# Patient Record
Sex: Male | Born: 1955 | Race: Black or African American | Hispanic: No | Marital: Married | Smoking: Never smoker
Health system: Southern US, Community
[De-identification: ages and names within clinical notes are randomized; demographics above are authoritative.]

## PROBLEM LIST (undated history)

## (undated) DIAGNOSIS — R7303 Prediabetes: Secondary | ICD-10-CM

## (undated) DIAGNOSIS — I10 Essential (primary) hypertension: Secondary | ICD-10-CM

## (undated) DIAGNOSIS — M170 Bilateral primary osteoarthritis of knee: Secondary | ICD-10-CM

## (undated) HISTORY — PX: HERNIA REPAIR: SHX51

## (undated) HISTORY — PX: COLONOSCOPY: SHX174

## (undated) HISTORY — PX: INGUINAL HERNIA REPAIR: SUR1180

---

## 2004-11-04 ENCOUNTER — Other Ambulatory Visit: Payer: Self-pay

## 2004-11-04 ENCOUNTER — Emergency Department: Payer: Self-pay | Admitting: Emergency Medicine

## 2007-12-28 ENCOUNTER — Ambulatory Visit: Payer: Self-pay | Admitting: Internal Medicine

## 2008-02-01 ENCOUNTER — Ambulatory Visit: Payer: Self-pay | Admitting: Gastroenterology

## 2008-06-25 ENCOUNTER — Ambulatory Visit: Payer: Self-pay | Admitting: Internal Medicine

## 2010-02-11 ENCOUNTER — Other Ambulatory Visit: Payer: Self-pay | Admitting: Internal Medicine

## 2010-02-13 ENCOUNTER — Ambulatory Visit: Payer: Self-pay | Admitting: Internal Medicine

## 2011-11-04 ENCOUNTER — Emergency Department: Payer: Self-pay | Admitting: Emergency Medicine

## 2013-02-24 ENCOUNTER — Ambulatory Visit: Payer: Self-pay | Admitting: Unknown Physician Specialty

## 2013-08-22 ENCOUNTER — Encounter (HOSPITAL_COMMUNITY): Payer: Self-pay | Admitting: Emergency Medicine

## 2013-08-22 ENCOUNTER — Emergency Department (HOSPITAL_COMMUNITY)
Admission: EM | Admit: 2013-08-22 | Discharge: 2013-08-22 | Disposition: A | Discharge status: Home / Self Care | Payer: 59 | Attending: Emergency Medicine | Admitting: Emergency Medicine

## 2013-08-22 DIAGNOSIS — X503XXA Overexertion from repetitive movements, initial encounter: Secondary | ICD-10-CM | POA: Insufficient documentation

## 2013-08-22 DIAGNOSIS — I10 Essential (primary) hypertension: Secondary | ICD-10-CM | POA: Insufficient documentation

## 2013-08-22 DIAGNOSIS — S335XXA Sprain of ligaments of lumbar spine, initial encounter: Secondary | ICD-10-CM | POA: Insufficient documentation

## 2013-08-22 DIAGNOSIS — Z79899 Other long term (current) drug therapy: Secondary | ICD-10-CM | POA: Insufficient documentation

## 2013-08-22 DIAGNOSIS — S39012A Strain of muscle, fascia and tendon of lower back, initial encounter: Secondary | ICD-10-CM

## 2013-08-22 DIAGNOSIS — Y9389 Activity, other specified: Secondary | ICD-10-CM | POA: Insufficient documentation

## 2013-08-22 DIAGNOSIS — Y929 Unspecified place or not applicable: Secondary | ICD-10-CM | POA: Insufficient documentation

## 2013-08-22 HISTORY — DX: Prediabetes: R73.03

## 2013-08-22 HISTORY — DX: Essential (primary) hypertension: I10

## 2013-08-22 LAB — URINALYSIS, ROUTINE W REFLEX MICROSCOPIC
Bilirubin Urine: NEGATIVE
Glucose, UA: NEGATIVE mg/dL
Ketones, ur: NEGATIVE mg/dL
Leukocytes, UA: NEGATIVE
Nitrite: NEGATIVE
Protein, ur: NEGATIVE mg/dL

## 2013-08-22 LAB — CBC WITH DIFFERENTIAL/PLATELET
Basophils Absolute: 0 10*3/uL (ref 0.0–0.1)
Basophils Relative: 0 % (ref 0–1)
Eosinophils Absolute: 0.2 10*3/uL (ref 0.0–0.7)
HCT: 49.5 % (ref 39.0–52.0)
Hemoglobin: 16.7 g/dL (ref 13.0–17.0)
Lymphs Abs: 2.4 10*3/uL (ref 0.7–4.0)
MCH: 27.6 pg (ref 26.0–34.0)
MCHC: 33.7 g/dL (ref 30.0–36.0)
Monocytes Relative: 8 % (ref 3–12)
Neutro Abs: 3.1 10*3/uL (ref 1.7–7.7)
Neutrophils Relative %: 50 % (ref 43–77)
RBC: 6.04 MIL/uL — ABNORMAL HIGH (ref 4.22–5.81)

## 2013-08-22 LAB — COMPREHENSIVE METABOLIC PANEL
Albumin: 4.1 g/dL (ref 3.5–5.2)
Alkaline Phosphatase: 56 U/L (ref 39–117)
BUN: 18 mg/dL (ref 6–23)
Chloride: 100 mEq/L (ref 96–112)
Creatinine, Ser: 1.34 mg/dL (ref 0.50–1.35)
GFR calc Af Amer: 65 mL/min — ABNORMAL LOW (ref >90)
GFR calc non Af Amer: 56 mL/min — ABNORMAL LOW (ref >90)
Glucose, Bld: 99 mg/dL (ref 70–99)
Potassium: 3.5 mEq/L (ref 3.5–5.1)
Total Bilirubin: 0.2 mg/dL — ABNORMAL LOW (ref 0.3–1.2)
Total Protein: 8 g/dL (ref 6.0–8.3)

## 2013-08-22 LAB — URINE MICROSCOPIC-ADD ON

## 2013-08-22 MED ORDER — KETOROLAC TROMETHAMINE 30 MG/ML IJ SOLN: 30.0000 mg | Freq: Once | INTRAMUSCULAR | Status: DC

## 2013-08-22 MED ORDER — OXYCODONE-ACETAMINOPHEN 5-325 MG PO TABS
2.0000 | ORAL_TABLET | Freq: Once | ORAL | Status: AC
  Administered 2013-08-22: 2 via ORAL
  Filled 2013-08-22: qty 2

## 2013-08-22 MED ORDER — NAPROXEN 500 MG PO TABS: 500.0000 mg | ORAL_TABLET | Freq: Two times a day (BID) | ORAL | Status: DC

## 2013-08-22 MED ORDER — OXYCODONE-ACETAMINOPHEN 5-325 MG PO TABS: 2.0000 | ORAL_TABLET | Freq: Four times a day (QID) | ORAL | Status: DC | PRN

## 2013-08-22 MED ORDER — NAPROXEN 250 MG PO TABS
500.0000 mg | ORAL_TABLET | Freq: Two times a day (BID) | ORAL | Status: DC
  Administered 2013-08-22: 500 mg via ORAL
  Filled 2013-08-22: qty 2

## 2013-08-22 MED ORDER — METHOCARBAMOL 750 MG PO TABS: 1500.0000 mg | ORAL_TABLET | Freq: Three times a day (TID) | ORAL | Status: DC

## 2013-08-22 MED ORDER — METHOCARBAMOL 500 MG PO TABS
1500.0000 mg | ORAL_TABLET | Freq: Three times a day (TID) | ORAL | Status: DC
  Administered 2013-08-22: 1500 mg via ORAL
  Filled 2013-08-22: qty 3

## 2013-08-22 NOTE — ED Provider Notes (Signed)
CSN: [id number]     Arrival date & time 08/22/13  4098 History   First MD Initiated Contact with Patient 08/22/13 [phone]     Chief Complaint  Patient presents with  . Abdominal Pain   (Consider location/radiation/quality/duration/timing/severity/associated sxs/prior Treatment) HPI 57 year old male presents to emergency room with complaint of intermittent low back pain radiating around to his abdomen starting a week ago Saturday.  Patient reports he does heavy lifting at work.  No prior history of same.  Pain starts in his low back and wraps around.  Patient thought he might have a urinary tract infection, and drank cranberry juice with possible improvement.  Over the last few days, however, symptoms have worsened.  Patient reports pain with movement.  No nausea no vomiting.  No diarrhea.  No incontinence.  No fevers.  Pain is worse after sitting for a period time, and improves, as he moves.  No treatment prior to arrival.  Past history of diabetes, borderline, and hypertension.  He has had a bilateral inguinal hernia repairs.  No dysuria. Past Medical History  Diagnosis Date  . Borderline diabetes   . Hypertension    Past Surgical History  Procedure Laterality Date  . Hernia repair     No family history on file. History  Substance Use Topics  . Smoking status: Never Smoker   . Smokeless tobacco: Not on file  . Alcohol Use: No    Review of Systems  All other systems reviewed and are negative.    Allergies  Shrimp  Home Medications   Current Outpatient Rx  Name  Route  Sig  Dispense  Refill  . acetaminophen (TYLENOL) 500 MG tablet   Oral   Take 500 mg by mouth every 6 (six) hours as needed for mild pain or moderate pain (arthritis).         Marland Kitchen losartan-hydrochlorothiazide (HYZAAR) 100-12.5 MG per tablet   Oral   Take 1 tablet by mouth daily.         . methocarbamol (ROBAXIN) 750 MG tablet   Oral   Take 2 tablets (1,500 mg total) by mouth 3 (three) times daily.   30  tablet   0   . naproxen (NAPROSYN) 500 MG tablet   Oral   Take 1 tablet (500 mg total) by mouth 2 (two) times daily with a meal.   30 tablet   0   . oxyCODONE-acetaminophen (PERCOCET/ROXICET) 5-325 MG per tablet   Oral   Take 2 tablets by mouth every 6 (six) hours as needed for severe pain.   30 tablet   0    BP 119/81  Pulse 74  Temp(Src) 97.6 F (36.4 C) (Oral)  Resp 14  Wt 222 lb 6 oz (100.869 kg)  SpO2 99% Physical Exam  Nursing note and vitals reviewed. Constitutional: He is oriented to person, place, and time. He appears well-developed and well-nourished. No distress.  HENT:  Head: Normocephalic and atraumatic.  Nose: Nose normal.  Mouth/Throat: Oropharynx is clear and moist.  Eyes: Conjunctivae and EOM are normal. Pupils are equal, round, and reactive to light.  Neck: Normal range of motion. Neck supple. No JVD present. No tracheal deviation present. No thyromegaly present.  Cardiovascular: Normal rate, regular rhythm, normal heart sounds and intact distal pulses.  Exam reveals no gallop and no friction rub.   No murmur heard. Pulmonary/Chest: Effort normal and breath sounds normal. No stridor. No respiratory distress. He has no wheezes. He has no rales. He exhibits no tenderness.  Abdominal: Soft. Bowel sounds are normal. He exhibits no distension and no mass. There is no tenderness (abdomen is soft, no tenderness.). There is no rebound and no guarding.  Musculoskeletal: Normal range of motion. He exhibits tenderness (patient has tenderness paraspinal, low back and SI joints bilaterally.  Palpation of this area reproduces his pain). He exhibits no edema.  Lymphadenopathy:    He has no cervical adenopathy.  Neurological: He is alert and oriented to person, place, and time. He has normal reflexes. He exhibits normal muscle tone. Coordination normal.  Skin: Skin is warm and dry. No rash noted. No erythema. No pallor.  Psychiatric: He has a normal mood and affect. His  behavior is normal. Judgment and thought content normal.    ED Course  Procedures (including critical care time) Labs Review Labs Reviewed  CBC WITH DIFFERENTIAL - Abnormal; Notable for the following:    RBC 6.04 (*)    All other components within normal limits  COMPREHENSIVE METABOLIC PANEL - Abnormal; Notable for the following:    Total Bilirubin 0.2 (*)    GFR calc non Af Amer 56 (*)    GFR calc Af Amer 65 (*)    All other components within normal limits  URINALYSIS, ROUTINE W REFLEX MICROSCOPIC - Abnormal; Notable for the following:    Hgb urine dipstick SMALL (*)    All other components within normal limits  URINE MICROSCOPIC-ADD ON   Imaging Review No results found.  EKG Interpretation   None       MDM   1. Back strain, initial encounter    57 year old male with low back pain.  No red flags on history or physical.  Labs normal.  Will tx pain, have patient f/u with pcm.    Olivia Mackie, MD 08/22/13 [phone]

## 2013-08-22 NOTE — ED Notes (Signed)
Pt. reports low abdominal and low back pain for 2 weeks denies nausea , vomitting or diarrhea . No fever or chills.

## 2013-09-11 ENCOUNTER — Ambulatory Visit: Payer: Self-pay | Admitting: Family Medicine

## 2014-08-21 DIAGNOSIS — Z8679 Personal history of other diseases of the circulatory system: Secondary | ICD-10-CM | POA: Insufficient documentation

## 2015-02-26 DIAGNOSIS — Z7185 Encounter for immunization safety counseling: Secondary | ICD-10-CM | POA: Insufficient documentation

## 2015-06-24 ENCOUNTER — Ambulatory Visit (INDEPENDENT_AMBULATORY_CARE_PROVIDER_SITE_OTHER): Payer: 59 | Admitting: Emergency Medicine

## 2015-06-24 VITALS — BP 140/90 | HR 70 | Temp 98.2°F | Resp 16 | Ht 73.0 in | Wt 208.0 lb

## 2015-06-24 DIAGNOSIS — M7551 Bursitis of right shoulder: Secondary | ICD-10-CM

## 2015-06-24 DIAGNOSIS — M5431 Sciatica, right side: Secondary | ICD-10-CM

## 2015-06-24 MED ORDER — CYCLOBENZAPRINE HCL 10 MG PO TABS: 10.0000 mg | ORAL_TABLET | Freq: Three times a day (TID) | ORAL | Status: DC | PRN

## 2015-06-24 MED ORDER — NAPROXEN SODIUM 550 MG PO TABS: 550.0000 mg | ORAL_TABLET | Freq: Two times a day (BID) | ORAL | Status: AC

## 2015-06-24 MED ORDER — HYDROCODONE-ACETAMINOPHEN 5-325 MG PO TABS: 1.0000 | ORAL_TABLET | ORAL | Status: DC | PRN

## 2015-06-24 NOTE — Patient Instructions (Signed)
Sciatica Sciatica is pain, weakness, numbness, or tingling along the path of the sciatic nerve. The nerve starts in the lower back and runs down the back of each leg. The nerve controls the muscles in the lower leg and in the back of the knee, while also providing sensation to the back of the thigh, lower leg, and the sole of your foot. Sciatica is a symptom of another medical condition. For instance, nerve damage or certain conditions, such as a herniated disk or bone spur on the spine, pinch or put pressure on the sciatic nerve. This causes the pain, weakness, or other sensations normally associated with sciatica. Generally, sciatica only affects one side of the body. CAUSES   Herniated or slipped disc.  Degenerative disk disease.  A pain disorder involving the narrow muscle in the buttocks (piriformis syndrome).  Pelvic injury or fracture.  Pregnancy.  Tumor (rare). SYMPTOMS  Symptoms can vary from mild to very severe. The symptoms usually travel from the low back to the buttocks and down the back of the leg. Symptoms can include:  Mild tingling or dull aches in the lower back, leg, or hip.  Numbness in the back of the calf or sole of the foot.  Burning sensations in the lower back, leg, or hip.  Sharp pains in the lower back, leg, or hip.  Leg weakness.  Severe back pain inhibiting movement. These symptoms may get worse with coughing, sneezing, laughing, or prolonged sitting or standing. Also, being overweight may worsen symptoms. DIAGNOSIS  Your caregiver will perform a physical exam to look for common symptoms of sciatica. He or she may ask you to do certain movements or activities that would trigger sciatic nerve pain. Other tests may be performed to find the cause of the sciatica. These may include:  Blood tests.  X-rays.  Imaging tests, such as an MRI or CT scan. TREATMENT  Treatment is directed at the cause of the sciatic pain. Sometimes, treatment is not necessary  and the pain and discomfort goes away on its own. If treatment is needed, your caregiver may suggest:  Over-the-counter medicines to relieve pain.  Prescription medicines, such as anti-inflammatory medicine, muscle relaxants, or narcotics.  Applying heat or ice to the painful area.  Steroid injections to lessen pain, irritation, and inflammation around the nerve.  Reducing activity during periods of pain.  Exercising and stretching to strengthen your abdomen and improve flexibility of your spine. Your caregiver may suggest losing weight if the extra weight makes the back pain worse.  Physical therapy.  Surgery to eliminate what is pressing or pinching the nerve, such as a bone spur or part of a herniated disk. HOME CARE INSTRUCTIONS   Only take over-the-counter or prescription medicines for pain or discomfort as directed by your caregiver.  Apply ice to the affected area for 20 minutes, 3-4 times a day for the first 48-72 hours. Then try heat in the same way.  Exercise, stretch, or perform your usual activities if these do not aggravate your pain.  Attend physical therapy sessions as directed by your caregiver.  Keep all follow-up appointments as directed by your caregiver.  Do not wear high heels or shoes that do not provide proper support.  Check your mattress to see if it is too soft. A firm mattress may lessen your pain and discomfort. SEEK IMMEDIATE MEDICAL CARE IF:   You lose control of your bowel or bladder (incontinence).  You have increasing weakness in the lower back, pelvis, buttocks,   or legs.  You have redness or swelling of your back.  You have a burning sensation when you urinate.  You have pain that gets worse when you lie down or awakens you at night.  Your pain is worse than you have experienced in the past.  Your pain is lasting longer than 4 weeks.  You are suddenly losing weight without reason. MAKE SURE YOU:  Understand these  instructions.  Will watch your condition.  Will get help right away if you are not doing well or get worse. Document Released: 09/01/2001 Document Revised: 03/08/2012 Document Reviewed: 01/17/2012 Exit[employer] Patient Information 2015 [employer]. This information is not intended to replace advice given to you by your health care provider. Make sure you discuss any questions you have with your health care provider.  

## 2015-06-24 NOTE — Progress Notes (Signed)
Subjective:  Patient ID: Chad Quinn, male    DOB: 17-Mar-1956  Age: 59 y.o. MRN: [id number]  CC: Back Pain and Shoulder Pain   HPI Chad Quinn presents  as a week long history of pain in his right shoulder. He says that he can't lay on that shoulder and has difficulty lifting due to pain. Has a long history of low back pain was told on MRI that he had bulging disks. He has an orthopedic surgeon that he sees. His pain is worse with standing bending and lifting. He has no history of direct injury or overuse. He does a lot of lifting at work and has been working more because he has limited help due to medications. He has pain that radiates down with leg to his foot has no weakness. No improvement with over-the-counter medication  History Chad Quinn has a past medical history of Borderline diabetes and Hypertension.   He has past surgical history that includes Hernia repair.   His  family history includes Cancer in his father.  He   reports that he has never smoked. He does not have any smokeless tobacco history on file. He reports that he does not drink alcohol or use illicit drugs.  Outpatient Prescriptions Prior to Visit  Medication Sig Dispense Refill  . acetaminophen (TYLENOL) 500 MG tablet Take 500 mg by mouth every 6 (six) hours as needed for mild pain or moderate pain (arthritis).    Marland Kitchen losartan-hydrochlorothiazide (HYZAAR) 100-12.5 MG per tablet Take 1 tablet by mouth daily.    . methocarbamol (ROBAXIN) 750 MG tablet Take 2 tablets (1,500 mg total) by mouth 3 (three) times daily. 30 tablet 0  . naproxen (NAPROSYN) 500 MG tablet Take 1 tablet (500 mg total) by mouth 2 (two) times daily with a meal. 30 tablet 0  . oxyCODONE-acetaminophen (PERCOCET/ROXICET) 5-325 MG per tablet Take 2 tablets by mouth every 6 (six) hours as needed for severe pain. 30 tablet 0   No facility-administered medications prior to visit.    Social History   Social History  . Marital Status: Married      Spouse Name: N/A  . Number of Children: N/A  . Years of Education: N/A   Social History Main Topics  . Smoking status: Never Smoker   . Smokeless tobacco: None  . Alcohol Use: No  . Drug Use: No  . Sexual Activity: Not Asked   Other Topics Concern  . None   Social History Narrative     Review of Systems  Objective:  BP 140/90 mmHg  Pulse 70  Temp(Src) 98.2 F (36.8 C) (Oral)  Resp 16  Ht 6\' 1"  (1.854 m)  Wt 208 lb (94.348 kg)  BMI 27.45 kg/m2  SpO2 98%  Physical Exam    Assessment & Plan:   Chad Quinn was seen today for back pain and shoulder pain.  Diagnoses and all orders for this visit:  Sciatic neuralgia, right  Shoulder bursitis, right  Other orders -     naproxen sodium (ANAPROX DS) 550 MG tablet; Take 1 tablet (550 mg total) by mouth 2 (two) times daily with a meal. -     cyclobenzaprine (FLEXERIL) 10 MG tablet; Take 1 tablet (10 mg total) by mouth 3 (three) times daily as needed for muscle spasms. -     HYDROcodone-acetaminophen (NORCO) 5-325 MG tablet; Take 1-2 tablets by mouth every 4 (four) hours as needed.   I have discontinued Mr. Ambrosius methocarbamol, naproxen, and oxyCODONE-acetaminophen. I am  also having him start on naproxen sodium, cyclobenzaprine, and HYDROcodone-acetaminophen. Additionally, I am having him maintain his acetaminophen and losartan-hydrochlorothiazide.  Meds ordered this encounter  Medications  . naproxen sodium (ANAPROX DS) 550 MG tablet    Sig: Take 1 tablet (550 mg total) by mouth 2 (two) times daily with a meal.    Dispense:  40 tablet    Refill:  0  . cyclobenzaprine (FLEXERIL) 10 MG tablet    Sig: Take 1 tablet (10 mg total) by mouth 3 (three) times daily as needed for muscle spasms.    Dispense:  30 tablet    Refill:  0  . HYDROcodone-acetaminophen (NORCO) 5-325 MG tablet    Sig: Take 1-2 tablets by mouth every 4 (four) hours as needed.    Dispense:  30 tablet    Refill:  0    Appropriate red flag  conditions were discussed with the patient as well as actions that should be taken.  Patient expressed his understanding.  Follow-up: Return if symptoms worsen or fail to improve.  Roselee Culver, MD

## 2016-02-27 DIAGNOSIS — R972 Elevated prostate specific antigen [PSA]: Secondary | ICD-10-CM | POA: Diagnosis not present

## 2016-02-27 DIAGNOSIS — Z23 Encounter for immunization: Secondary | ICD-10-CM | POA: Diagnosis not present

## 2016-02-27 DIAGNOSIS — Z0000 Encounter for general adult medical examination without abnormal findings: Secondary | ICD-10-CM | POA: Diagnosis not present

## 2016-06-03 DIAGNOSIS — B9689 Other specified bacterial agents as the cause of diseases classified elsewhere: Secondary | ICD-10-CM | POA: Diagnosis not present

## 2016-06-03 DIAGNOSIS — J019 Acute sinusitis, unspecified: Secondary | ICD-10-CM | POA: Diagnosis not present

## 2016-06-03 DIAGNOSIS — J208 Acute bronchitis due to other specified organisms: Secondary | ICD-10-CM | POA: Diagnosis not present

## 2016-06-08 DIAGNOSIS — R7303 Prediabetes: Secondary | ICD-10-CM | POA: Diagnosis not present

## 2016-06-08 DIAGNOSIS — B9689 Other specified bacterial agents as the cause of diseases classified elsewhere: Secondary | ICD-10-CM | POA: Diagnosis not present

## 2016-06-08 DIAGNOSIS — Z8679 Personal history of other diseases of the circulatory system: Secondary | ICD-10-CM | POA: Diagnosis not present

## 2016-06-08 DIAGNOSIS — J208 Acute bronchitis due to other specified organisms: Secondary | ICD-10-CM | POA: Diagnosis not present

## 2016-07-09 ENCOUNTER — Ambulatory Visit: Admit: 2016-07-09 | Payer: 59 | Admitting: Unknown Physician Specialty

## 2016-07-09 DIAGNOSIS — K5730 Diverticulosis of large intestine without perforation or abscess without bleeding: Secondary | ICD-10-CM | POA: Diagnosis not present

## 2016-07-09 DIAGNOSIS — D126 Benign neoplasm of colon, unspecified: Secondary | ICD-10-CM | POA: Diagnosis not present

## 2016-07-09 DIAGNOSIS — D123 Benign neoplasm of transverse colon: Secondary | ICD-10-CM | POA: Diagnosis not present

## 2016-07-09 DIAGNOSIS — Z86010 Personal history of colonic polyps: Secondary | ICD-10-CM | POA: Diagnosis not present

## 2016-07-09 DIAGNOSIS — Z8371 Family history of colonic polyps: Secondary | ICD-10-CM | POA: Diagnosis not present

## 2016-07-09 DIAGNOSIS — K635 Polyp of colon: Secondary | ICD-10-CM | POA: Diagnosis not present

## 2016-07-09 SURGERY — COLONOSCOPY
Anesthesia: General

## 2016-10-02 DIAGNOSIS — R5381 Other malaise: Secondary | ICD-10-CM | POA: Diagnosis not present

## 2016-10-02 DIAGNOSIS — R6883 Chills (without fever): Secondary | ICD-10-CM | POA: Diagnosis not present

## 2016-10-02 DIAGNOSIS — R6889 Other general symptoms and signs: Secondary | ICD-10-CM | POA: Diagnosis not present

## 2016-10-02 DIAGNOSIS — R509 Fever, unspecified: Secondary | ICD-10-CM | POA: Diagnosis not present

## 2016-10-02 DIAGNOSIS — R0981 Nasal congestion: Secondary | ICD-10-CM | POA: Diagnosis not present

## 2016-10-08 ENCOUNTER — Emergency Department
Admission: EM | Admit: 2016-10-08 | Discharge: 2016-10-08 | Disposition: A | Discharge status: Home / Self Care | Payer: PRIVATE HEALTH INSURANCE | Attending: Emergency Medicine | Admitting: Emergency Medicine

## 2016-10-08 ENCOUNTER — Encounter: Payer: Self-pay | Admitting: *Deleted

## 2016-10-08 DIAGNOSIS — S6992XA Unspecified injury of left wrist, hand and finger(s), initial encounter: Secondary | ICD-10-CM | POA: Diagnosis present

## 2016-10-08 DIAGNOSIS — Y929 Unspecified place or not applicable: Secondary | ICD-10-CM | POA: Diagnosis not present

## 2016-10-08 DIAGNOSIS — I10 Essential (primary) hypertension: Secondary | ICD-10-CM | POA: Insufficient documentation

## 2016-10-08 DIAGNOSIS — Y990 Civilian activity done for income or pay: Secondary | ICD-10-CM | POA: Insufficient documentation

## 2016-10-08 DIAGNOSIS — S61412A Laceration without foreign body of left hand, initial encounter: Secondary | ICD-10-CM | POA: Diagnosis not present

## 2016-10-08 DIAGNOSIS — Z79899 Other long term (current) drug therapy: Secondary | ICD-10-CM | POA: Diagnosis not present

## 2016-10-08 DIAGNOSIS — W268XXA Contact with other sharp object(s), not elsewhere classified, initial encounter: Secondary | ICD-10-CM | POA: Insufficient documentation

## 2016-10-08 DIAGNOSIS — Y9389 Activity, other specified: Secondary | ICD-10-CM | POA: Diagnosis not present

## 2016-10-08 MED ORDER — LIDOCAINE HCL (PF) 1 % IJ SOLN
5.0000 mL | Freq: Once | INTRAMUSCULAR | Status: DC
  Filled 2016-10-08: qty 5

## 2016-10-08 NOTE — ED Notes (Signed)
Wound irrigated with betadine and NS

## 2016-10-08 NOTE — ED Triage Notes (Signed)
Pt arrives with laceration to left outer hand, bleeding controlled, states tetanus status up to date

## 2016-10-08 NOTE — ED Notes (Signed)
Chad Quinn called on workers comp profile for information regarding testing, no answer, number on her voicemail called, no answer received

## 2016-10-08 NOTE — ED Provider Notes (Signed)
[facility] Emergency Department Provider Note  ____________________________________________  Time seen: Approximately 10:45 AM  I have reviewed the triage vital signs and the nursing notes.   HISTORY  Chief Complaint Laceration    HPI Chad Quinn is a 61 y.o. male , NAD, presents to the emergency department with 15 minute history of laceration to the left hand. Patient states while working today, he was cutting box stops off of boxes when the box cutter slipped and cut the back of his left hand. Has applied direct pressure to the area since the injury and has retained all range of motion of the left wrist, hand and fingers. Denies any numbness, weakness, tingling. States that his tetanus vaccination was updated within the last year.   Past Medical History:  Diagnosis Date  . Borderline diabetes   . Hypertension     There are no active problems to display for this patient.   Past Surgical History:  Procedure Laterality Date  . HERNIA REPAIR      Prior to Admission medications   Medication Sig Start Date End Date Taking? Authorizing Provider  acetaminophen (TYLENOL) 500 MG tablet Take 500 mg by mouth every 6 (six) hours as needed for mild pain or moderate pain (arthritis).    Historical Provider, MD  cyclobenzaprine (FLEXERIL) 10 MG tablet Take 1 tablet (10 mg total) by mouth 3 (three) times daily as needed for muscle spasms. 06/24/15   Roselee Culver, MD  HYDROcodone-acetaminophen (NORCO) 5-325 MG tablet Take 1-2 tablets by mouth every 4 (four) hours as needed. 06/24/15   Roselee Culver, MD  losartan-hydrochlorothiazide (HYZAAR) 100-12.5 MG per tablet Take 1 tablet by mouth daily.    Historical Provider, MD    Allergies Shrimp [shellfish allergy]  Family History  Problem Relation Age of Onset  . Cancer Father     Social History Social History  Substance Use Topics  . Smoking status: Never Smoker  . Smokeless tobacco: Not on file   . Alcohol use No     Review of Systems  Constitutional: No fever/chills Musculoskeletal: Negative for Left wrist, hand and finger pain.  Skin: Positive laceration left dorsal hand. Negative for rash, redness, swelling, bruising. Neurological: Negative for Numbness, wheezes, tingling  ____________________________________________   PHYSICAL EXAM:  VITAL SIGNS: ED Triage Vitals  Enc Vitals Group     BP 10/08/16 1035 (!) 145/81     Pulse Rate 10/08/16 1035 86     Resp 10/08/16 1035 18     Temp 10/08/16 1035 97.7 F (36.5 C)     Temp Source 10/08/16 1035 Oral     SpO2 10/08/16 1035 98 %     Weight 10/08/16 1036 218 lb (98.9 kg)     Height 10/08/16 1036 6\' 2"  (1.88 m)     Head Circumference --      Peak Flow --      Pain Score --      Pain Loc --      Pain Edu? --      Excl. in [place]? --      Constitutional: Alert and oriented. Well appearing and in no acute distress. Eyes: Conjunctivae are normal.  Head: Atraumatic. Cardiovascular: Good peripheral circulation with 2+ pulses noted in the left upper extremity. Capillary refill is brisk in all digits of left hand. Respiratory: Normal respiratory effort without tachypnea or retractions.  Musculoskeletal: Full range of motion of the left forearm, wrist, hand and fingers without pain or difficulty. Neurologic:  Normal speech and language. No gross focal neurologic deficits are appreciated. Sensation light touch grossly intact about the left upper extremity. Skin:  3 cm, linear, clean laceration noted about the dorsal left hand at the base of the thumb. No visualized or palpated foreign bodies. No active bleeding. No surrounding cellulitis or bruising. Skin is warm, dry. No rash noted. Psychiatric: Mood and affect are normal. Speech and behavior are normal. Patient exhibits appropriate insight and judgement.   ____________________________________________    LABS  None ____________________________________________  EKG  None ____________________________________________  RADIOLOGY  None ____________________________________________    PROCEDURES  Procedure(s) performed: None   .Marland KitchenLaceration Repair Date/Time: 10/08/2016 11:57 AM Performed by: Braxton Feathers Authorized by: Braxton Feathers   Consent:    Consent obtained:  Verbal   Consent given by:  Patient   Risks discussed:  Infection, pain and poor cosmetic result   Alternatives discussed:  No treatment Anesthesia (see MAR for exact dosages):    Anesthesia method:  Local infiltration   Local anesthetic:  Lidocaine 1% w/o epi Laceration details:    Location:  Hand   Hand location:  L hand, dorsum   Length (cm):  3   Depth (mm):  2 Repair type:    Repair type:  Simple Pre-procedure details:    Preparation:  Patient was prepped and draped in usual sterile fashion Exploration:    Hemostasis achieved with:  Direct pressure   Wound exploration: wound explored through full range of motion and entire depth of wound probed and visualized     Wound extent: areolar tissue violated and fascia violated     Wound extent: no foreign bodies/material noted, no muscle damage noted, no nerve damage noted, no tendon damage noted, no underlying fracture noted and no vascular damage noted     Contaminated: no   Treatment:    Area cleansed with:  Betadine   Amount of cleaning:  Standard   Irrigation solution:  Sterile water   Irrigation method:  Syringe   Foreign body removal: No foreign bodies.   Skin repair:    Repair method:  Sutures   Suture size:  4-0   Suture material:  Nylon   Suture technique:  Simple interrupted   Number of sutures:  6 Approximation:    Approximation:  Close   Vermilion border: well-aligned   Post-procedure details:    Dressing:  Non-adherent dressing   Patient tolerance of procedure:  Tolerated well, no immediate complications     Medications   lidocaine (PF) (XYLOCAINE) 1 % injection 5 mL (not administered)     ____________________________________________   INITIAL IMPRESSION / ASSESSMENT AND PLAN / ED COURSE  Pertinent labs & imaging results that were available during my care of the patient were reviewed by me and considered in my medical decision making (see chart for details).     Patient's diagnosis is consistent with laceration of left hand without foreign body. Patient will be discharged home with instructions to keep the wound clean and dry as well as covered while working. Patient given a work note stating he is not to lift heavy objects, strongly grip or submerge the left hand in water over the next 5-7 days. Patient is to follow up with [facility], [place] in 2 days for wound recheck and then again in 1 week for suture removal. Patient is given ED precautions to return to the ED for any worsening or new symptoms.    ____________________________________________  FINAL CLINICAL IMPRESSION(S) / ED  DIAGNOSES  Final diagnoses:  Laceration of left hand without foreign body, initial encounter      NEW MEDICATIONS STARTED DURING THIS VISIT:  Discharge Medication List as of 10/08/2016 12:01 PM           Braxton Feathers, PA-C 10/08/16 1434    Delman Kitten, MD 10/08/16 1558

## 2016-12-07 DIAGNOSIS — R300 Dysuria: Secondary | ICD-10-CM | POA: Diagnosis not present

## 2016-12-07 DIAGNOSIS — R7303 Prediabetes: Secondary | ICD-10-CM | POA: Diagnosis not present

## 2016-12-14 DIAGNOSIS — R319 Hematuria, unspecified: Secondary | ICD-10-CM | POA: Diagnosis not present

## 2017-01-16 ENCOUNTER — Emergency Department
Admission: EM | Admit: 2017-01-16 | Discharge: 2017-01-16 | Disposition: A | Discharge status: Home / Self Care | Payer: 59 | Attending: Emergency Medicine | Admitting: Emergency Medicine

## 2017-01-16 ENCOUNTER — Encounter: Payer: Self-pay | Admitting: Emergency Medicine

## 2017-01-16 DIAGNOSIS — Y929 Unspecified place or not applicable: Secondary | ICD-10-CM | POA: Diagnosis not present

## 2017-01-16 DIAGNOSIS — Y999 Unspecified external cause status: Secondary | ICD-10-CM | POA: Diagnosis not present

## 2017-01-16 DIAGNOSIS — S61212A Laceration without foreign body of right middle finger without damage to nail, initial encounter: Secondary | ICD-10-CM | POA: Diagnosis not present

## 2017-01-16 DIAGNOSIS — Z79899 Other long term (current) drug therapy: Secondary | ICD-10-CM | POA: Diagnosis not present

## 2017-01-16 DIAGNOSIS — I10 Essential (primary) hypertension: Secondary | ICD-10-CM | POA: Diagnosis not present

## 2017-01-16 DIAGNOSIS — Y9389 Activity, other specified: Secondary | ICD-10-CM | POA: Insufficient documentation

## 2017-01-16 DIAGNOSIS — W260XXA Contact with knife, initial encounter: Secondary | ICD-10-CM | POA: Insufficient documentation

## 2017-01-16 MED ORDER — BACITRACIN ZINC 500 UNIT/GM EX OINT
TOPICAL_OINTMENT | CUTANEOUS | Status: AC
  Administered 2017-01-16: 1 via TOPICAL
  Filled 2017-01-16: qty 0.9

## 2017-01-16 MED ORDER — BACITRACIN ZINC 500 UNIT/GM EX OINT
TOPICAL_OINTMENT | Freq: Once | CUTANEOUS | Status: AC
  Administered 2017-01-16: 1 via TOPICAL

## 2017-01-16 NOTE — ED Triage Notes (Signed)
Pt presents to ED with c/o finger laceration to his middle finger on his right hand. Pt states he was cutting food when the knife slipped and he cut his finger. Bleeding controlled.

## 2017-01-16 NOTE — Discharge Instructions (Signed)
Leave the dressing on for a day. When you change it you can clean the wound gently with peroxide. I would change dressing every day to try to keep the wound clean and dry if at all possible.  if you get the wound wet change the dressing. Please return for any signs of infection: increased pain and redness, swelling, pus, streaks up the finger, etc. You can follow-up with your doctor in about a week to see how it'sdoing or return here if you cannot follow-up with your doctor. If the wound is healing nicely and not getting infected you do not have to return or follow-up with your doctor.

## 2017-01-16 NOTE — ED Provider Notes (Signed)
[facility] Emergency Department Provider Note   ____________________________________________   First MD Initiated Contact with Patient 01/16/17 0354     (approximate)  I have reviewed the triage vital signs and the nursing notes.   HISTORY  Chief Complaint Extremity Laceration   HPI Chad Quinn is a 61 y.o. male patient was cutting food with a knife slipped and he cut his finger. He sliced some skin off the distal phalanx of the right middle finger. Bleeding has now stopped. The knife was clean. His tetanus shots up-to-date.   Past Medical History:  Diagnosis Date  . Borderline diabetes   . Hypertension     There are no active problems to display for this patient.   Past Surgical History:  Procedure Laterality Date  . HERNIA REPAIR      Prior to Admission medications   Medication Sig Start Date End Date Taking? Authorizing Provider  acetaminophen (TYLENOL) 500 MG tablet Take 500 mg by mouth every 6 (six) hours as needed for mild pain or moderate pain (arthritis).    Historical Provider, MD  cyclobenzaprine (FLEXERIL) 10 MG tablet Take 1 tablet (10 mg total) by mouth 3 (three) times daily as needed for muscle spasms. 06/24/15   Roselee Culver, MD  HYDROcodone-acetaminophen (NORCO) 5-325 MG tablet Take 1-2 tablets by mouth every 4 (four) hours as needed. 06/24/15   Roselee Culver, MD  losartan-hydrochlorothiazide (HYZAAR) 100-12.5 MG per tablet Take 1 tablet by mouth daily.    Historical Provider, MD    Allergies Shrimp [shellfish allergy]  Family History  Problem Relation Age of Onset  . Cancer Father     Social History Social History  Substance Use Topics  . Smoking status: Never Smoker  . Smokeless tobacco: Not on file  . Alcohol use No    Review of Systems  Constitutional: No fever/chills Eyes: No visual changes. ENT: No sore throat.  ____________________________________________   PHYSICAL EXAM:  VITAL  SIGNS: ED Triage Vitals  Enc Vitals Group     BP 01/16/17 0054 (!) 132/91     Pulse Rate 01/16/17 0054 85     Resp 01/16/17 0054 18     Temp 01/16/17 0054 98.5 F (36.9 C)     Temp Source 01/16/17 0054 Oral     SpO2 01/16/17 0054 98 %     Weight 01/16/17 0054 218 lb (98.9 kg)     Height 01/16/17 0054 6\' 2"  (1.88 m)     Head Circumference --      Peak Flow --      Pain Score 01/16/17 0057 7     Pain Loc --      Pain Edu? --      Excl. in [place]? --     Constitutional: Alert and oriented. Well appearing and in no acute distress. Eyes: Conjunctivae are normal. PERRL. EOMI. Head: Atraumatic. Nose: No congestion/rhinnorhea. Neck: No stridor  Musculoskeletal: There is a 3 x 8 area of skin missing off the side of the palmar surface of the patient's right long finger. It is basically a skin finger. Not very deep. Sensation capillary refill are intact elsewhere on the tip of the finger.  ____________________________________________   LABS (all labs ordered are listed, but only abnormal results are displayed)  Labs Reviewed - No data to display ____________________________________________  EKG  ____________________________________________  RADIOLOGY   ____________________________________________   PROCEDURES  Procedure(s) performed: Procedures  Critical Care performed:  ____________________________________________   INITIAL IMPRESSION /  ASSESSMENT AND PLAN / ED COURSE  Pertinent labs & imaging results that were available during my care of the patient were reviewed by me and considered in my medical decision making (see chart for details).        ____________________________________________   FINAL CLINICAL IMPRESSION(S) / ED DIAGNOSES  Final diagnoses:  Laceration of right middle finger without foreign body without damage to nail, initial encounter      NEW MEDICATIONS STARTED DURING THIS VISIT:  New Prescriptions   No medications on file     Note:   This document was prepared using Dragon voice recognition software and may include unintentional dictation errors.    Nena Polio, MD 01/16/17 [phone]

## 2017-01-16 NOTE — ED Notes (Signed)
Pt. States he can drive home in [place].

## 2017-01-16 NOTE — ED Notes (Signed)
Cleaned and applied dressing and bandage to third finger of rt. Hand.

## 2017-06-08 DIAGNOSIS — H40003 Preglaucoma, unspecified, bilateral: Secondary | ICD-10-CM | POA: Diagnosis not present

## 2017-09-16 DIAGNOSIS — H938X2 Other specified disorders of left ear: Secondary | ICD-10-CM | POA: Diagnosis not present

## 2017-09-16 DIAGNOSIS — H6122 Impacted cerumen, left ear: Secondary | ICD-10-CM | POA: Diagnosis not present

## 2017-09-16 DIAGNOSIS — J209 Acute bronchitis, unspecified: Secondary | ICD-10-CM | POA: Diagnosis not present

## 2018-01-23 ENCOUNTER — Encounter: Payer: Self-pay | Admitting: Emergency Medicine

## 2018-01-23 ENCOUNTER — Ambulatory Visit
Admission: EM | Admit: 2018-01-23 | Discharge: 2018-01-23 | Disposition: A | Discharge status: Home / Self Care | Payer: 59 | Attending: Family Medicine | Admitting: Family Medicine

## 2018-01-23 ENCOUNTER — Other Ambulatory Visit: Payer: Self-pay

## 2018-01-23 DIAGNOSIS — J0110 Acute frontal sinusitis, unspecified: Secondary | ICD-10-CM

## 2018-01-23 DIAGNOSIS — G4489 Other headache syndrome: Secondary | ICD-10-CM | POA: Diagnosis not present

## 2018-01-23 DIAGNOSIS — J0100 Acute maxillary sinusitis, unspecified: Secondary | ICD-10-CM | POA: Diagnosis not present

## 2018-01-23 MED ORDER — AMOXICILLIN 875 MG PO TABS: 875.0000 mg | ORAL_TABLET | Freq: Two times a day (BID) | ORAL | 0 refills | Status: DC

## 2018-01-23 NOTE — ED Triage Notes (Signed)
Patient states that he has been having HA and elevated blood pressure that started on Thursday.  Patient states that his PCP took him off his blood pressure medicine about 8 months ago due to a reaction.  Patient has not been on any other BP medicine.

## 2018-01-23 NOTE — ED Provider Notes (Signed)
[facility]    CSN: [id number] Arrival date & time: 01/23/18  1779     History   Chief Complaint Chief Complaint  Patient presents with  . Headache  . Hypertension    HPI Chad Quinn is a 62 y.o. male.   The history is provided by the patient.  Headache  Associated symptoms: congestion, facial pain, fatigue and URI   Hypertension  Associated symptoms include headaches.  URI  Presenting symptoms: congestion, facial pain and fatigue   Onset quality:  Sudden Duration:  5 days Timing:  Constant Progression:  Worsening Chronicity:  New Relieved by:  None tried Ineffective treatments:  None tried Associated symptoms: headaches and sinus pain   Associated symptoms comment:  Also states blood pressure has been running a little high Risk factors: sick contacts     Past Medical History:  Diagnosis Date  . Borderline diabetes   . Hypertension     There are no active problems to display for this patient.   Past Surgical History:  Procedure Laterality Date  . HERNIA REPAIR         Home Medications    Prior to Admission medications   Medication Sig Start Date End Date Taking? Authorizing Provider  acetaminophen (TYLENOL) 500 MG tablet Take 500 mg by mouth every 6 (six) hours as needed for mild pain or moderate pain (arthritis).    [provider]  amoxicillin (AMOXIL) 875 MG tablet Take 1 tablet (875 mg total) by mouth 2 (two) times daily. 01/23/18   Norval Gable, MD  cyclobenzaprine (FLEXERIL) 10 MG tablet Take 1 tablet (10 mg total) by mouth 3 (three) times daily as needed for muscle spasms. 06/24/15   Roselee Culver, MD  HYDROcodone-acetaminophen (NORCO) 5-325 MG tablet Take 1-2 tablets by mouth every 4 (four) hours as needed. 06/24/15   Roselee Culver, MD  losartan-hydrochlorothiazide (HYZAAR) 100-12.5 MG per tablet Take 1 tablet by mouth daily.    [provider]    Family History Family History  Problem Relation Age  of Onset  . Cancer Father     Social History Social History   Tobacco Use  . Smoking status: Never Smoker  . Smokeless tobacco: Never Used  Substance Use Topics  . Alcohol use: No  . Drug use: No     Allergies   Shrimp [shellfish allergy]   Review of Systems Review of Systems  Constitutional: Positive for fatigue.  HENT: Positive for congestion and sinus pain.   Neurological: Positive for headaches.     Physical Exam Triage Vital Signs ED Triage Vitals  Enc Vitals Group     BP 01/23/18 1021 (!) 135/92     Pulse Rate 01/23/18 1021 84     Resp 01/23/18 1021 16     Temp 01/23/18 1021 98.1 F (36.7 C)     Temp Source 01/23/18 1021 Oral     SpO2 01/23/18 1021 97 %     Weight 01/23/18 1018 218 lb (98.9 kg)     Height 01/23/18 1018 6\' 2"  (1.88 m)     Head Circumference --      Peak Flow --      Pain Score 01/23/18 1018 9     Pain Loc --      Pain Edu? --      Excl. in [place]? --    No data found.  Updated Vital Signs BP (!) 135/92 (BP Location: Right Arm)   Pulse 84   Temp  98.1 F (36.7 C) (Oral)   Resp 16   Ht 6\' 2"  (1.88 m)   Wt 218 lb (98.9 kg)   SpO2 97%   BMI 27.99 kg/m   Visual Acuity Right Eye Distance:   Left Eye Distance:   Bilateral Distance:    Right Eye Near:   Left Eye Near:    Bilateral Near:     Physical Exam  Constitutional: He is oriented to person, place, and time. He appears well-developed and well-nourished.  Non-toxic appearance. He does not appear ill. No distress.  HENT:  Head: Normocephalic and atraumatic.  Right Ear: Tympanic membrane, external ear and ear canal normal.  Left Ear: Tympanic membrane, external ear and ear canal normal.  Nose: Right sinus exhibits maxillary sinus tenderness and frontal sinus tenderness. Left sinus exhibits maxillary sinus tenderness and frontal sinus tenderness.  Mouth/Throat: Uvula is midline, oropharynx is clear and moist and mucous membranes are normal. No oropharyngeal exudate or tonsillar  abscesses.  Eyes: Pupils are equal, round, and reactive to light. Conjunctivae and EOM are normal. Right eye exhibits no discharge. Left eye exhibits no discharge. No scleral icterus.  Neck: Normal range of motion. Neck supple. No tracheal deviation present. No thyromegaly present.  Cardiovascular: Normal rate, regular rhythm and normal heart sounds.  Pulmonary/Chest: Effort normal and breath sounds normal. No stridor. No respiratory distress. He has no wheezes. He has no rales. He exhibits no tenderness.  Lymphadenopathy:    He has no cervical adenopathy.  Neurological: He is alert and oriented to person, place, and time. He is not disoriented. He displays normal reflexes. No cranial nerve deficit or sensory deficit. He exhibits normal muscle tone. Coordination normal.  Skin: Skin is warm and dry. No rash noted. He is not diaphoretic.  Nursing note and vitals reviewed.    UC Treatments / Results  Labs (all labs ordered are listed, but only abnormal results are displayed) Labs Reviewed - No data to display  EKG None  Radiology No results found.  Procedures Procedures (including critical care time)  Medications Ordered in UC Medications - No data to display  Initial Impression / Assessment and Plan / UC Course  I have reviewed the triage vital signs and the nursing notes.  Pertinent labs & imaging results that were available during my care of the patient were reviewed by me and considered in my medical decision making (see chart for details).      Final Clinical Impressions(s) / UC Diagnoses   Final diagnoses:  Acute frontal sinusitis, recurrence not specified  Acute maxillary sinusitis, recurrence not specified  Other headache syndrome   Discharge Instructions   None    ED Prescriptions    Medication Sig Dispense Auth. Provider   amoxicillin (AMOXIL) 875 MG tablet Take 1 tablet (875 mg total) by mouth 2 (two) times daily. 20 tablet Norval Gable, MD     1.  diagnosis reviewed with patient 2. rx as per orders above; reviewed possible side effects, interactions, risks and benefits  3. Follow up with PCP regarding monitoring of blood pressure 4. Follow-up prn if symptoms worsen or don't improve    Controlled Substance Prescriptions [place] Controlled Substance Registry consulted? Not Applicable   Norval Gable, MD 01/23/18 [phone]

## 2018-01-25 ENCOUNTER — Ambulatory Visit
Admission: RE | Admit: 2018-01-25 | Discharge: 2018-01-25 | Disposition: A | Discharge status: Home / Self Care | Payer: 59 | Source: Ambulatory Visit | Attending: Family Medicine | Admitting: Family Medicine

## 2018-01-25 ENCOUNTER — Other Ambulatory Visit: Payer: Self-pay | Admitting: Family Medicine

## 2018-01-25 DIAGNOSIS — R51 Headache: Secondary | ICD-10-CM | POA: Insufficient documentation

## 2018-01-25 DIAGNOSIS — R519 Headache, unspecified: Secondary | ICD-10-CM

## 2018-01-25 DIAGNOSIS — G319 Degenerative disease of nervous system, unspecified: Secondary | ICD-10-CM | POA: Diagnosis not present

## 2018-01-27 DIAGNOSIS — R51 Headache: Secondary | ICD-10-CM | POA: Diagnosis not present

## 2018-01-27 DIAGNOSIS — J329 Chronic sinusitis, unspecified: Secondary | ICD-10-CM | POA: Diagnosis not present

## 2018-01-27 DIAGNOSIS — H53149 Visual discomfort, unspecified: Secondary | ICD-10-CM | POA: Diagnosis not present

## 2018-01-27 DIAGNOSIS — M542 Cervicalgia: Secondary | ICD-10-CM | POA: Diagnosis not present

## 2018-01-27 DIAGNOSIS — H6123 Impacted cerumen, bilateral: Secondary | ICD-10-CM | POA: Diagnosis not present

## 2018-01-27 DIAGNOSIS — R110 Nausea: Secondary | ICD-10-CM | POA: Diagnosis not present

## 2018-07-19 DIAGNOSIS — S99921A Unspecified injury of right foot, initial encounter: Secondary | ICD-10-CM | POA: Diagnosis not present

## 2018-07-19 DIAGNOSIS — S92501A Displaced unspecified fracture of right lesser toe(s), initial encounter for closed fracture: Secondary | ICD-10-CM | POA: Diagnosis not present

## 2018-07-22 DIAGNOSIS — S92511D Displaced fracture of proximal phalanx of right lesser toe(s), subsequent encounter for fracture with routine healing: Secondary | ICD-10-CM | POA: Diagnosis not present

## 2018-07-22 DIAGNOSIS — B353 Tinea pedis: Secondary | ICD-10-CM | POA: Diagnosis not present

## 2018-08-12 DIAGNOSIS — M79672 Pain in left foot: Secondary | ICD-10-CM | POA: Diagnosis not present

## 2018-08-12 DIAGNOSIS — S92511D Displaced fracture of proximal phalanx of right lesser toe(s), subsequent encounter for fracture with routine healing: Secondary | ICD-10-CM | POA: Diagnosis not present

## 2018-09-06 DIAGNOSIS — S92511D Displaced fracture of proximal phalanx of right lesser toe(s), subsequent encounter for fracture with routine healing: Secondary | ICD-10-CM | POA: Diagnosis not present

## 2018-10-16 ENCOUNTER — Encounter: Payer: Self-pay | Admitting: Emergency Medicine

## 2018-10-16 ENCOUNTER — Other Ambulatory Visit: Payer: Self-pay

## 2018-10-16 DIAGNOSIS — I10 Essential (primary) hypertension: Secondary | ICD-10-CM | POA: Insufficient documentation

## 2018-10-16 DIAGNOSIS — K5732 Diverticulitis of large intestine without perforation or abscess without bleeding: Secondary | ICD-10-CM | POA: Insufficient documentation

## 2018-10-16 DIAGNOSIS — R1030 Lower abdominal pain, unspecified: Secondary | ICD-10-CM | POA: Diagnosis not present

## 2018-10-16 DIAGNOSIS — K5730 Diverticulosis of large intestine without perforation or abscess without bleeding: Secondary | ICD-10-CM | POA: Diagnosis not present

## 2018-10-16 DIAGNOSIS — K5792 Diverticulitis of intestine, part unspecified, without perforation or abscess without bleeding: Secondary | ICD-10-CM | POA: Diagnosis not present

## 2018-10-16 LAB — URINALYSIS, COMPLETE (UACMP) WITH MICROSCOPIC
Bacteria, UA: NONE SEEN
Bilirubin Urine: NEGATIVE
Glucose, UA: NEGATIVE mg/dL
Ketones, ur: NEGATIVE mg/dL
Leukocytes, UA: NEGATIVE
Nitrite: NEGATIVE
Protein, ur: NEGATIVE mg/dL
Specific Gravity, Urine: 1.019 (ref 1.005–1.030)
Squamous Epithelial / HPF: NONE SEEN (ref 0–5)
pH: 6 (ref 5.0–8.0)

## 2018-10-16 LAB — CBC
HCT: 44.8 % (ref 39.0–52.0)
Hemoglobin: 14.1 g/dL (ref 13.0–17.0)
MCH: 25.9 pg — ABNORMAL LOW (ref 26.0–34.0)
MCHC: 31.5 g/dL (ref 30.0–36.0)
MCV: 82.2 fL (ref 80.0–100.0)
Platelets: 241 10*3/uL (ref 150–400)
RBC: 5.45 MIL/uL (ref 4.22–5.81)
RDW: 14.5 % (ref 11.5–15.5)
WBC: 5.8 10*3/uL (ref 4.0–10.5)
nRBC: 0 % (ref 0.0–0.2)

## 2018-10-16 LAB — COMPREHENSIVE METABOLIC PANEL
ALT: 25 U/L (ref 0–44)
AST: 24 U/L (ref 15–41)
Albumin: 4 g/dL (ref 3.5–5.0)
Alkaline Phosphatase: 51 U/L (ref 38–126)
Anion gap: 8 (ref 5–15)
BILIRUBIN TOTAL: 0.5 mg/dL (ref 0.3–1.2)
BUN: 15 mg/dL (ref 8–23)
CALCIUM: 9.1 mg/dL (ref 8.9–10.3)
CO2: 30 mmol/L (ref 22–32)
Chloride: 102 mmol/L (ref 98–111)
Creatinine, Ser: 1.23 mg/dL (ref 0.61–1.24)
GFR calc Af Amer: >60 mL/min (ref >60)
GFR calc non Af Amer: >60 mL/min (ref >60)
Glucose, Bld: 111 mg/dL — ABNORMAL HIGH (ref 70–99)
Potassium: 3.7 mmol/L (ref 3.5–5.1)
Sodium: 140 mmol/L (ref 135–145)
Total Protein: 7.6 g/dL (ref 6.5–8.1)

## 2018-10-16 LAB — LIPASE, BLOOD: Lipase: 38 U/L (ref 11–51)

## 2018-10-16 MED ORDER — ONDANSETRON 4 MG PO TBDP
4.0000 mg | ORAL_TABLET | Freq: Once | ORAL | Status: AC | PRN
  Administered 2018-10-16: 4 mg via ORAL
  Filled 2018-10-16: qty 1

## 2018-10-16 MED ORDER — SODIUM CHLORIDE 0.9% FLUSH: 3.0000 mL | Freq: Once | INTRAVENOUS | Status: DC

## 2018-10-16 NOTE — ED Triage Notes (Signed)
Pt reports pain to lower midline abd and left lower quadrant; has been taking imodium and pepto bismal for the last 2 weeks for diarrhea; pt is now having normal bowel movements; sharp constant pain; some nausea, no vomiting; history of diverticulitis but the pain has never been this bad; pt says voiding more than normal; pt talking in complete coherent sentences;

## 2018-10-17 ENCOUNTER — Emergency Department: Payer: 59

## 2018-10-17 ENCOUNTER — Emergency Department
Admission: EM | Admit: 2018-10-17 | Discharge: 2018-10-17 | Disposition: A | Discharge status: Home / Self Care | Payer: 59 | Attending: Emergency Medicine | Admitting: Emergency Medicine

## 2018-10-17 ENCOUNTER — Encounter: Payer: Self-pay | Admitting: Radiology

## 2018-10-17 DIAGNOSIS — R1030 Lower abdominal pain, unspecified: Secondary | ICD-10-CM

## 2018-10-17 DIAGNOSIS — I10 Essential (primary) hypertension: Secondary | ICD-10-CM | POA: Diagnosis not present

## 2018-10-17 DIAGNOSIS — K5732 Diverticulitis of large intestine without perforation or abscess without bleeding: Secondary | ICD-10-CM | POA: Diagnosis not present

## 2018-10-17 DIAGNOSIS — K5730 Diverticulosis of large intestine without perforation or abscess without bleeding: Secondary | ICD-10-CM | POA: Diagnosis not present

## 2018-10-17 DIAGNOSIS — K5792 Diverticulitis of intestine, part unspecified, without perforation or abscess without bleeding: Secondary | ICD-10-CM | POA: Diagnosis not present

## 2018-10-17 MED ORDER — ONDANSETRON 4 MG PO TBDP: 4.0000 mg | ORAL_TABLET | Freq: Three times a day (TID) | ORAL | 0 refills | Status: DC | PRN

## 2018-10-17 MED ORDER — CIPROFLOXACIN HCL 500 MG PO TABS: 500.0000 mg | ORAL_TABLET | Freq: Two times a day (BID) | ORAL | 0 refills | Status: DC

## 2018-10-17 MED ORDER — CIPROFLOXACIN HCL 500 MG PO TABS
500.0000 mg | ORAL_TABLET | Freq: Once | ORAL | Status: AC
  Administered 2018-10-17: 500 mg via ORAL
  Filled 2018-10-17: qty 1

## 2018-10-17 MED ORDER — IOPAMIDOL (ISOVUE-300) INJECTION 61%
100.0000 mL | Freq: Once | INTRAVENOUS | Status: AC | PRN
  Administered 2018-10-17: 100 mL via INTRAVENOUS

## 2018-10-17 MED ORDER — KETOROLAC TROMETHAMINE 30 MG/ML IJ SOLN
15.0000 mg | INTRAMUSCULAR | Status: AC
  Administered 2018-10-17: 15 mg via INTRAVENOUS
  Filled 2018-10-17: qty 1

## 2018-10-17 MED ORDER — NAPROXEN 500 MG PO TABS: 500.0000 mg | ORAL_TABLET | Freq: Two times a day (BID) | ORAL | 0 refills | Status: DC

## 2018-10-17 MED ORDER — METRONIDAZOLE 500 MG PO TABS
500.0000 mg | ORAL_TABLET | Freq: Once | ORAL | Status: AC
  Administered 2018-10-17: 500 mg via ORAL
  Filled 2018-10-17: qty 1

## 2018-10-17 MED ORDER — SODIUM CHLORIDE 0.9 % IV BOLUS
500.0000 mL | Freq: Once | INTRAVENOUS | Status: AC
  Administered 2018-10-17: 500 mL via INTRAVENOUS

## 2018-10-17 MED ORDER — METRONIDAZOLE 500 MG PO TABS: 500.0000 mg | ORAL_TABLET | Freq: Three times a day (TID) | ORAL | 0 refills | Status: DC

## 2018-10-17 MED ORDER — OXYCODONE-ACETAMINOPHEN 5-325 MG PO TABS
1.0000 | ORAL_TABLET | Freq: Once | ORAL | Status: AC
  Administered 2018-10-17: 1 via ORAL
  Filled 2018-10-17: qty 1

## 2018-10-17 NOTE — ED Provider Notes (Signed)
[facility] Emergency Department Provider Note  ____________________________________________  Time seen: Approximately 5:37 AM  I have reviewed the triage vital signs and the nursing notes.   HISTORY  Chief Complaint Abdominal Pain and Diarrhea    HPI Chad Quinn is a 63 y.o. male with a history of hypertension who complains of lower abdominal pain worse in the left lower quadrant for the past 2 weeks.  Associated with loose bowel movements.  Nausea but no vomiting.  No fevers or chills.  No aggravating or alleviating factors, nonradiating, moderate intensity.  Feels like diverticulitis has had in the past.      Past Medical History:  Diagnosis Date  . Borderline diabetes   . Hypertension      There are no active problems to display for this patient.    Past Surgical History:  Procedure Laterality Date  . HERNIA REPAIR       Prior to Admission medications   Medication Sig Start Date End Date Taking? Authorizing Provider  acetaminophen (TYLENOL) 500 MG tablet Take 500 mg by mouth every 6 (six) hours as needed for mild pain or moderate pain (arthritis).   Yes [provider]  ciprofloxacin (CIPRO) 500 MG tablet Take 1 tablet (500 mg total) by mouth 2 (two) times daily. 10/17/18   Carrie Mew, MD  metroNIDAZOLE (FLAGYL) 500 MG tablet Take 1 tablet (500 mg total) by mouth 3 (three) times daily. 10/17/18   Carrie Mew, MD  naproxen (NAPROSYN) 500 MG tablet Take 1 tablet (500 mg total) by mouth 2 (two) times daily with a meal. 10/17/18   Carrie Mew, MD  ondansetron (ZOFRAN ODT) 4 MG disintegrating tablet Take 1 tablet (4 mg total) by mouth every 8 (eight) hours as needed for nausea or vomiting. 10/17/18   Carrie Mew, MD     Allergies Shrimp [shellfish allergy]   Family History  Problem Relation Age of Onset  . Cancer Father     Social History Social History   Tobacco Use  . Smoking status: Never Smoker   . Smokeless tobacco: Never Used  Substance Use Topics  . Alcohol use: Yes    Comment: occasional  . Drug use: No    Review of Systems  Constitutional:   No fever or chills.  ENT:   No sore throat. No rhinorrhea. Cardiovascular:   No chest pain or syncope. Respiratory:   No dyspnea or cough. Gastrointestinal: Positive for abdominal pain without vomiting or diarrhea. Musculoskeletal:   Negative for focal pain or swelling All other systems reviewed and are negative except as documented above in ROS and HPI.  ____________________________________________   PHYSICAL EXAM:  VITAL SIGNS: ED Triage Vitals  Enc Vitals Group     BP 10/16/18 2303 137/88     Pulse Rate 10/16/18 2303 84     Resp 10/16/18 2303 18     Temp 10/16/18 2303 98 F (36.7 C)     Temp Source 10/16/18 2303 Oral     SpO2 10/16/18 2303 97 %     Weight 10/16/18 2303 220 lb (99.8 kg)     Height 10/16/18 2303 6\' 2"  (1.88 m)     Head Circumference --      Peak Flow --      Pain Score 10/16/18 2321 8     Pain Loc --      Pain Edu? --      Excl. in [place]? --     Vital signs reviewed, nursing assessments reviewed.  Constitutional:   Alert and oriented. Non-toxic appearance.  Obese Eyes:   Conjunctivae are normal. EOMI. PERRL. ENT      Head:   Normocephalic and atraumatic.      Nose:   No congestion/rhinnorhea.       Mouth/Throat:   MMM, no pharyngeal erythema. No peritonsillar mass.       Neck:   No meningismus. Full ROM. Hematological/Lymphatic/Immunilogical:   No cervical lymphadenopathy. Cardiovascular:   RRR. Symmetric bilateral radial and DP pulses.  No murmurs. Cap refill less than 2 seconds. Respiratory:   Normal respiratory effort without tachypnea/retractions. Breath sounds are clear and equal bilaterally. No wheezes/rales/rhonchi. Gastrointestinal:   Soft with diffuse lower abdominal tenderness. Non distended. There is no CVA tenderness.  No rebound, rigidity, or guarding. Musculoskeletal:   Normal  range of motion in all extremities. No joint effusions.  No lower extremity tenderness.  No edema. Neurologic:   Normal speech and language.  Motor grossly intact. No acute focal neurologic deficits are appreciated.  Skin:    Skin is warm, dry and intact. No rash noted.  No petechiae, purpura, or bullae.  ____________________________________________    LABS (pertinent positives/negatives) (all labs ordered are listed, but only abnormal results are displayed) Labs Reviewed  COMPREHENSIVE METABOLIC PANEL - Abnormal; Notable for the following components:      Result Value   Glucose, Bld 111 (*)    All other components within normal limits  CBC - Abnormal; Notable for the following components:   MCH 25.9 (*)    All other components within normal limits  URINALYSIS, COMPLETE (UACMP) WITH MICROSCOPIC - Abnormal; Notable for the following components:   Color, Urine YELLOW (*)    APPearance CLEAR (*)    Hgb urine dipstick MODERATE (*)    All other components within normal limits  LIPASE, BLOOD   ____________________________________________   EKG    ____________________________________________    RADIOLOGY  Ct Abdomen Pelvis W Contrast  Result Date: 10/17/2018 CLINICAL DATA:  Abd pain, diverticulitis suspected EXAM: CT ABDOMEN AND PELVIS WITH CONTRAST TECHNIQUE: Multidetector CT imaging of the abdomen and pelvis was performed using the standard protocol following bolus administration of intravenous contrast. CONTRAST:  168mL ISOVUE-300 IOPAMIDOL (ISOVUE-300) INJECTION 61% COMPARISON:  None. FINDINGS: Lower chest: Hypoventilatory atelectasis at the lung bases. Hepatobiliary: No focal hepatic abnormality. Gallbladder is only minimally distended, no calcified gallstone. No pericholecystic inflammation. No biliary dilatation. Pancreas: No ductal dilatation or inflammation. Spleen: Normal in size without focal abnormality. Adrenals/Urinary Tract: Normal adrenal glands. No hydronephrosis or  perinephric edema. Homogeneous renal enhancement with symmetric excretion on delayed phase imaging. Subcentimeter cyst in the posterior left kidney. Urinary bladder is partially distended. Stomach/Bowel: Stomach physiologically distended. No small bowel dilatation, inflammation, or obstruction. Extensive diverticulosis throughout the colon. Mild pericolonic edema in the proximal sigmoid colon, image 63/2, suggesting early acute diverticulitis. No abscess or perforation. Normal appendix. Vascular/Lymphatic: Aortic atherosclerosis. Infrarenal abdominal ectasia at 2.5 cm. Calcified non classified plaque in the aorta and left greater than right common iliac arteries. No acute vascular findings. Portal vein and mesenteric vessels are patent. No adenopathy. Reproductive: Prostate is unremarkable. Other: No free air, free fluid, or intra-abdominal fluid collection. Tiny fat containing umbilical hernia. Musculoskeletal: Multilevel degenerative change in the spine. There are no acute or suspicious osseous abnormalities. Incidental note of intramuscular lipoma within right hip musculature. IMPRESSION: 1. Extensive colonic diverticulosis. Suggestion of mild early acute diverticulitis of the proximal sigmoid colon with pericolonic edema. No perforation or abscess. 2.  Aortic Atherosclerosis (ICD10-I70.0). 3. Ectatic abdominal aorta at risk for aneurysm development. Recommend followup by ultrasound in 5 years. This recommendation follows ACR consensus guidelines: White Paper of the ACR Incidental Findings Committee II on Vascular Findings. J Am Coll Radiol 2013; 10:789-794. Aortic aneurysm NOS (ICD10-I71.9) Electronically Signed   By: Keith Rake M.D.   On: 10/17/2018 04:00    ____________________________________________   PROCEDURES Procedures  ____________________________________________  DIFFERENTIAL DIAGNOSIS   Appendicitis, diverticulitis, bowel obstruction, perforation  CLINICAL IMPRESSION / ASSESSMENT  AND PLAN / ED COURSE  Pertinent labs & imaging results that were available during my care of the patient were reviewed by me and considered in my medical decision making (see chart for details).    Patient presents with lower abdominal pain for 2 weeks.  Not consistent with viral syndrome.  Labs unremarkable, urinalysis negative, vital signs normal.  Due to his history of diabetes according the patient, CT scan obtained which does show mild diverticulitis.  I will start him on Cipro Flagyl as well as Zofran.  Follow-up with primary care.  Pain controlled in the ED.      ____________________________________________   FINAL CLINICAL IMPRESSION(S) / ED DIAGNOSES    Final diagnoses:  Lower abdominal pain  Diverticulitis     ED Discharge Orders         Ordered    ciprofloxacin (CIPRO) 500 MG tablet  2 times daily     10/17/18 0536    metroNIDAZOLE (FLAGYL) 500 MG tablet  3 times daily     10/17/18 0536    ondansetron (ZOFRAN ODT) 4 MG disintegrating tablet  Every 8 hours PRN     10/17/18 0536    naproxen (NAPROSYN) 500 MG tablet  2 times daily with meals     10/17/18 0536          Portions of this note were generated with dragon dictation software. Dictation errors may occur despite best attempts at proofreading.   Carrie Mew, MD 10/17/18 [phone]

## 2018-10-24 DIAGNOSIS — Z86010 Personal history of colonic polyps: Secondary | ICD-10-CM | POA: Diagnosis not present

## 2018-10-24 DIAGNOSIS — Z8719 Personal history of other diseases of the digestive system: Secondary | ICD-10-CM | POA: Diagnosis not present

## 2018-12-19 ENCOUNTER — Encounter: Admission: RE | Payer: Self-pay | Source: Home / Self Care

## 2018-12-19 ENCOUNTER — Ambulatory Visit: Admission: RE | Admit: 2018-12-19 | Payer: 59 | Source: Home / Self Care | Admitting: Unknown Physician Specialty

## 2018-12-19 SURGERY — COLONOSCOPY WITH PROPOFOL
Anesthesia: General

## 2020-10-15 ENCOUNTER — Encounter (HOSPITAL_COMMUNITY): Payer: Self-pay

## 2020-10-15 ENCOUNTER — Emergency Department (HOSPITAL_COMMUNITY)
Admission: EM | Admit: 2020-10-15 | Discharge: 2020-10-16 | Disposition: A | Discharge status: Home / Self Care | Payer: Medicare Other | Attending: Emergency Medicine | Admitting: Emergency Medicine

## 2020-10-15 ENCOUNTER — Emergency Department (HOSPITAL_COMMUNITY): Payer: Medicare Other

## 2020-10-15 ENCOUNTER — Other Ambulatory Visit: Payer: Self-pay

## 2020-10-15 DIAGNOSIS — R1010 Upper abdominal pain, unspecified: Secondary | ICD-10-CM | POA: Insufficient documentation

## 2020-10-15 DIAGNOSIS — J1282 Pneumonia due to coronavirus disease 2019: Secondary | ICD-10-CM | POA: Diagnosis not present

## 2020-10-15 DIAGNOSIS — I10 Essential (primary) hypertension: Secondary | ICD-10-CM | POA: Diagnosis not present

## 2020-10-15 DIAGNOSIS — R059 Cough, unspecified: Secondary | ICD-10-CM | POA: Diagnosis present

## 2020-10-15 DIAGNOSIS — U071 COVID-19: Secondary | ICD-10-CM

## 2020-10-15 NOTE — ED Triage Notes (Signed)
Patient reports runny nose, L flank pain, sore throat, emesis starting today, denies any covid exposure. Reports he has had two covid shots.

## 2020-10-16 LAB — SARS CORONAVIRUS 2 BY RT PCR (HOSPITAL ORDER, PERFORMED IN ~~LOC~~ HOSPITAL LAB): SARS Coronavirus 2: POSITIVE — AB

## 2020-10-16 MED ORDER — IBUPROFEN 400 MG PO TABS
400.0000 mg | ORAL_TABLET | Freq: Once | ORAL | Status: AC | PRN
  Administered 2020-10-16: 400 mg via ORAL
  Filled 2020-10-16: qty 1

## 2020-10-16 NOTE — ED Notes (Signed)
Discharge instructions reviewed and patient confirmed understanding

## 2020-10-16 NOTE — Discharge Instructions (Signed)
Stay well-hydrated water.  Take Tylenol every 4 hours and ibuprofen every 6 hours as needed for fevers or body aches.  Return for worsening shortness of breath, persistent vomiting or new concerns. Isolate and mask as discussed.

## 2020-10-16 NOTE — ED Notes (Signed)
MD at bedside. 

## 2020-10-16 NOTE — ED Provider Notes (Signed)
[facility] EMERGENCY DEPARTMENT Provider Note   CSN: [id number] Arrival date & time: 10/15/20  2253     History Chief Complaint  Patient presents with   Nasal Congestion   Cough    Chad Quinn is a 65 y.o. male.  Patient with history of high blood pressure not on medications presents with body aches, upper abdominal discomfort, cough and sore throat since yesterday.  There was outbreak of Covid at work.  Patient is vaccinated.  No shortness of breath or fevers.        Past Medical History:  Diagnosis Date   Borderline diabetes    Hypertension     There are no problems to display for this patient.   Past Surgical History:  Procedure Laterality Date   HERNIA REPAIR         Family History  Problem Relation Age of Onset   Cancer Father     Social History   Tobacco Use   Smoking status: Never Smoker   Smokeless tobacco: Never Used  Vaping Use   Vaping Use: Never used  Substance Use Topics   Alcohol use: Yes    Comment: occasional   Drug use: No    Home Medications Prior to Admission medications   Medication Sig Start Date End Date Taking? Authorizing Provider  acetaminophen (TYLENOL) 500 MG tablet Take 500 mg by mouth every 6 (six) hours as needed for mild pain or moderate pain (arthritis).    [provider]  ciprofloxacin (CIPRO) 500 MG tablet Take 1 tablet (500 mg total) by mouth 2 (two) times daily. 10/17/18   Carrie Mew, MD  metroNIDAZOLE (FLAGYL) 500 MG tablet Take 1 tablet (500 mg total) by mouth 3 (three) times daily. 10/17/18   Carrie Mew, MD  naproxen (NAPROSYN) 500 MG tablet Take 1 tablet (500 mg total) by mouth 2 (two) times daily with a meal. 10/17/18   Carrie Mew, MD  ondansetron (ZOFRAN ODT) 4 MG disintegrating tablet Take 1 tablet (4 mg total) by mouth every 8 (eight) hours as needed for nausea or vomiting. 10/17/18   Carrie Mew, MD    Allergies    Shrimp [shellfish  allergy]  Review of Systems   Review of Systems  Constitutional: Negative for chills and fever.  HENT: Positive for congestion.   Eyes: Negative for visual disturbance.  Respiratory: Positive for cough. Negative for shortness of breath.   Cardiovascular: Negative for chest pain.  Gastrointestinal: Positive for abdominal pain. Negative for vomiting.  Genitourinary: Negative for dysuria and flank pain.  Musculoskeletal: Negative for back pain, neck pain and neck stiffness.  Skin: Negative for rash.  Neurological: Negative for light-headedness and headaches.    Physical Exam Updated Vital Signs BP (!) 144/92 (BP Location: Right Arm)    Pulse 96    Temp 99.7 F (37.6 C) (Oral)    Resp 18    Ht 6\' 2"  (1.88 m)    Wt 104.3 kg    SpO2 97%    BMI 29.53 kg/m   Physical Exam Vitals and nursing note reviewed.  Constitutional:      Appearance: He is well-developed and well-nourished.  HENT:     Head: Normocephalic and atraumatic.     Nose: Congestion present.  Eyes:     General:        Right eye: No discharge.        Left eye: No discharge.     Conjunctiva/sclera: Conjunctivae normal.  Neck:  Trachea: No tracheal deviation.  Cardiovascular:     Rate and Rhythm: Normal rate and regular rhythm.  Pulmonary:     Effort: Pulmonary effort is normal.     Breath sounds: Normal breath sounds.  Abdominal:     General: There is no distension.     Palpations: Abdomen is soft.     Tenderness: There is no abdominal tenderness. There is no guarding.  Musculoskeletal:        General: No edema.     Cervical back: Normal range of motion and neck supple.  Skin:    General: Skin is warm.     Findings: No rash.  Neurological:     Mental Status: He is alert and oriented to person, place, and time.  Psychiatric:        Mood and Affect: Mood and affect and mood normal.     ED Results / Procedures / Treatments   Labs (all labs ordered are listed, but only abnormal results are  displayed) Labs Reviewed  SARS CORONAVIRUS 2 BY RT PCR (HOSPITAL ORDER, New Site LAB) - Abnormal; Notable for the following components:      Result Value   SARS Coronavirus 2 POSITIVE (*)    All other components within normal limits    EKG None  Radiology DG Chest Portable 1 View  Result Date: 10/15/2020 CLINICAL DATA:  Dyspnea EXAM: PORTABLE CHEST 1 VIEW COMPARISON:  None. FINDINGS: The heart size and mediastinal contours are within normal limits. Both lungs are clear. The visualized skeletal structures are unremarkable. IMPRESSION: No active disease. Electronically Signed   By: Fidela Salisbury MD   On: 10/15/2020 23:19    Procedures Procedures   Medications Ordered in ED Medications  ibuprofen (ADVIL) tablet 400 mg (has no administration in time range)    ED Course  I have reviewed the triage vital signs and the nursing notes.  Pertinent labs & imaging results that were available during my care of the patient were reviewed by me and considered in my medical decision making (see chart for details).    MDM Rules/Calculators/A&P                          Patient presents with clinical concern for Covid pneumonia versus other viral process.  Lungs are clear, normal work of breathing, normal oxygenation.  Chest x-ray reviewed no acute infiltrate.  Covid test returned positive.  Discussed isolation, work note, supportive care and reasons to return.  Chad Quinn was evaluated in Emergency Department on 10/16/2020 for the symptoms described in the history of present illness. He was evaluated in the context of the global COVID-19 pandemic, which necessitated consideration that the patient might be at risk for infection with the SARS-CoV-2 virus that causes COVID-19. Institutional protocols and algorithms that pertain to the evaluation of patients at risk for COVID-19 are in a state of rapid change based on information released by regulatory bodies including the  CDC and federal and state organizations. These policies and algorithms were followed during the patient's care in the ED.   Final Clinical Impression(s) / ED Diagnoses Final diagnoses:  Pneumonia due to COVID-19 virus    Rx / DC Orders ED Discharge Orders    None       Elnora Morrison, MD 10/16/20 [phone]

## 2020-10-16 NOTE — ED Notes (Signed)
Patient moved to appropriate waiting area

## 2020-10-16 NOTE — ED Notes (Signed)
Ambulated pt on pulse ox, spo2 dropped to 97%

## 2020-10-17 ENCOUNTER — Telehealth: Payer: Self-pay | Admitting: *Deleted

## 2020-10-17 NOTE — Telephone Encounter (Signed)
Called to discuss with patient about COVID-19 symptoms and the use of one of the available treatments for those with mild to moderate Covid symptoms and at a high risk of hospitalization.  Pt appears to qualify for outpatient treatment due to co-morbid conditions and/or a member of an at-risk group in accordance with the FDA Emergency Use Authorization.    Symptom onset:  Vaccinated:  Booster?  Immunocompromised?  Qualifiers: BMI.25, African American  Unable to reach pt - Left VM to return call for information regarding treatment for Covid/symptoms.  Tarry Kos

## 2020-10-21 ENCOUNTER — Other Ambulatory Visit: Payer: Self-pay | Admitting: Student

## 2020-11-04 ENCOUNTER — Encounter: Payer: Self-pay | Admitting: Urology

## 2020-11-04 ENCOUNTER — Ambulatory Visit (INDEPENDENT_AMBULATORY_CARE_PROVIDER_SITE_OTHER): Payer: Medicare Other | Admitting: Urology

## 2020-11-04 ENCOUNTER — Other Ambulatory Visit: Payer: Self-pay

## 2020-11-04 VITALS — BP 161/86 | HR 93 | Ht 74.0 in | Wt 190.0 lb

## 2020-11-04 DIAGNOSIS — R7303 Prediabetes: Secondary | ICD-10-CM | POA: Insufficient documentation

## 2020-11-04 DIAGNOSIS — N486 Induration penis plastica: Secondary | ICD-10-CM | POA: Diagnosis not present

## 2020-11-04 DIAGNOSIS — R3121 Asymptomatic microscopic hematuria: Secondary | ICD-10-CM | POA: Diagnosis not present

## 2020-11-04 DIAGNOSIS — R319 Hematuria, unspecified: Secondary | ICD-10-CM

## 2020-11-04 LAB — MICROSCOPIC EXAMINATION: WBC, UA: NONE SEEN /hpf (ref 0–5)

## 2020-11-04 LAB — URINALYSIS, COMPLETE
Bilirubin, UA: NEGATIVE
Glucose, UA: NEGATIVE
Ketones, UA: NEGATIVE
Leukocytes,UA: NEGATIVE
Nitrite, UA: NEGATIVE
Protein,UA: NEGATIVE
Specific Gravity, UA: 1.02 (ref 1.005–1.030)
Urobilinogen, Ur: 0.2 mg/dL (ref 0.2–1.0)
pH, UA: 5.5 (ref 5.0–7.5)

## 2020-11-04 NOTE — Patient Instructions (Signed)
You had a small amount of microscopic blood in the urine, this may be from your recent Covid infection.  We need to repeat your urine test in 4 to 6 weeks, and if blood is still present consider a CT scan to look at the kidneys and cystoscopy to look at the bladder   Peyronies disease is curvature of the penis with erections.  If you are not having any pain and this is not bothersome and it does not prevent intercourse, no treatment is needed.  If the curvature worsens, we can consider other treatment options like injections(Xiaflex) to help straighten the curve.

## 2020-11-04 NOTE — Progress Notes (Signed)
11/04/20 11:02 AM   Chad Quinn [birth year] [id number]  CC: Microscopic hematuria, penile curvature  HPI: I saw Chad Quinn in urology clinic for the above issues.  He is a relatively healthy 65 year old male who recently was diagnosed with Covid on 10/15/2020.  Urinalysis at that time was notable for 4-10 RBCs, but otherwise benign.  He denies any urinary symptoms aside from some occasional urgency during the day.  He denies any gross hematuria, dysuria, history of UTIs, or retention.  He denies any family history of prostate cancer.  He has a minimal smoking history when he is a teenager, but he did previously work in a textile dye house for around 10 years.  He also reports some new penile curvature over the last month.  He denies any pain with erections, or difficulty with erections.  He notes about a 45 degree curvature to the left.  This does not impact his sexual activity, and is not bothersome to him or his partner.  CT abdomen pelvis from January 2020 with no urologic abnormalities, significant diverticulosis and diverticulitis noted.  I personally reviewed and interpreted the imaging.  PMH: Past Medical History:  Diagnosis Date  . Borderline diabetes   . Hypertension     Surgical History: Past Surgical History:  Procedure Laterality Date  . INGUINAL HERNIA REPAIR Bilateral     Family History: Family History  Problem Relation Age of Onset  . Cancer Father   . Prostate cancer Neg Hx   . Kidney cancer Neg Hx   . Bladder Cancer Neg Hx     Social History:  reports that he has never smoked. He has never used smokeless tobacco. He reports current alcohol use. He reports that he does not use drugs.  Physical Exam: BP (!) 161/86   Pulse 93   Ht 6\' 2"  (1.88 m)   Wt 190 lb (86.2 kg)   BMI 24.39 kg/m    Constitutional:  Alert and oriented, No acute distress. Cardiovascular: No clubbing, cyanosis, or edema. Respiratory: Normal respiratory effort, no increased work  of breathing. GI: Abdomen is soft, nontender, nondistended, no abdominal masses GU: Phallus with patent meatus, subtle left-sided lateral plaque at the mid shaft, no other lesions  Laboratory Data: Reviewed, see HPI  Assessment & Plan:   He is a 65 year old relatively healthy male with 2 episodes of asymptomatic low-grade microscopic hematuria with 4-10 RBCs, however both of these came within 2 weeks of being diagnosed with COVID-19.  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  We discussed common possible etiologies of microscopic hematuria including idiopathic, urolithiasis, medical renal disease, and malignancy. We discussed the new asymptomatic microscopic hematuria guidelines and risk categories of low, intermediate, and high risk that are based on age, risk factors like smoking, and degree of microscopic hematuria. We discussed work-up can range from repeat urinalysis, renal ultrasound and cystoscopy, to CT urogram and cystoscopy.  With his microscopic hematuria in the setting of recent viral infection with COVID-19, I think it is reasonable to repeat a urinalysis and 1 month and if microscopic hematuria has resolved can defer work-up, but would recommend a repeat UA again in 6 to 12 months.  If he has persistent microscopic hematuria at that time would pursue full work-up with CT urogram and cystoscopy.  Regarding the Peyronie's disease, we discussed observation is a very reasonable first treatment strategy as  he is not currently bothered.  We reviewed other options in the future including Xiaflex injections or surgical interventions if he has worsening curvature or bothersome symptoms.  Chad Madrid, MD 11/04/2020  [facility] [address] [place], [place] [address] [phone]

## 2020-12-11 ENCOUNTER — Other Ambulatory Visit: Payer: Self-pay

## 2020-12-11 ENCOUNTER — Encounter: Payer: Self-pay | Admitting: Urology

## 2020-12-11 ENCOUNTER — Ambulatory Visit: Payer: Medicare Other | Admitting: Urology

## 2020-12-11 VITALS — BP 162/86 | HR 80 | Ht 74.0 in | Wt 190.0 lb

## 2020-12-11 DIAGNOSIS — R3121 Asymptomatic microscopic hematuria: Secondary | ICD-10-CM

## 2020-12-11 DIAGNOSIS — N486 Induration penis plastica: Secondary | ICD-10-CM

## 2020-12-11 NOTE — Progress Notes (Signed)
   12/11/2020 9:39 AM   Garner Gavel [birth year] [id number]  Reason for visit: Follow up asymptomatic microscopic hematuria, peyronies disease  HPI: I saw Mr. Alipio back in clinic for the above issues.  Briefly he had a urinalysis with 3-10 RBCs at the time of recent Covid diagnosis, without any urinary symptoms.  He has a minimal smoking history, but previously worked in a [employer].  At our last visit we opted to repeat a urinalysis to confirm persistent microscopic hematuria after his viral illness.  Urinalysis today with 3-10 RBCs and otherwise benign. We discussed common possible etiologies of microscopic hematuria including idiopathic, urolithiasis, medical renal disease, and malignancy. We discussed the new asymptomatic microscopic hematuria guidelines and risk categories of low, intermediate, and high risk that are based on age, risk factors like smoking, and degree of microscopic hematuria. We discussed work-up can range from repeat urinalysis, renal ultrasound and cystoscopy, to CT urogram and cystoscopy.  They fall into the high risk category, and I recommended proceeding with cystoscopy and CT urogram.   Regarding his peyronies disease, he denies any change in his curvature pain with erections, and is okay with ongoing observation.    CT and cystoscopy to complete microscopic hematuria work-up   Billey Co, MD  [facility] [address] [place], [place] [address] [phone]

## 2020-12-11 NOTE — Patient Instructions (Signed)
Cystoscopy Cystoscopy is a procedure that is used to help diagnose and sometimes treat conditions that affect the lower urinary tract. The lower urinary tract includes the bladder and the urethra. The urethra is the tube that drains urine from the bladder. Cystoscopy is done using a thin, tube-shaped instrument with a light and camera at the end (cystoscope). The cystoscope may be hard or flexible, depending on the goal of the procedure. The cystoscope is inserted through the urethra, into the bladder. Cystoscopy may be recommended if you have:  Urinary tract infections that keep coming back.  Blood in the urine (hematuria).  An inability to control when you urinate (urinary incontinence) or an overactive bladder.  Unusual cells found in a urine sample.  A blockage in the urethra, such as a urinary stone.  Painful urination.  An abnormality in the bladder found during an intravenous pyelogram (IVP) or CT scan. Cystoscopy may also be done to remove a sample of tissue to be examined under a microscope (biopsy). What are the risks? Generally, this is a safe procedure. However, problems may occur, including:  Infection.  Bleeding.  What happens during the procedure?  1. You will be given one or more of the following: ? A medicine to numb the area (local anesthetic). 2. The area around the opening of your urethra will be cleaned. 3. The cystoscope will be passed through your urethra into your bladder. 4. Germ-free (sterile) fluid will flow through the cystoscope to fill your bladder. The fluid will stretch your bladder so that your health care provider can clearly examine your bladder walls. 5. Your doctor will look at the urethra and bladder. 6. The cystoscope will be removed The procedure may vary among health care providers  What can I expect after the procedure? After the procedure, it is common to have: 1. Some soreness or pain in your abdomen and urethra. 2. Urinary symptoms.  These include: ? Mild pain or burning when you urinate. Pain should stop within a few minutes after you urinate. This may last for up to 1 week. ? A small amount of blood in your urine for several days. ? Feeling like you need to urinate but producing only a small amount of urine. Follow these instructions at home: General instructions  Return to your normal activities as told by your health care provider.   Do not drive for 24 hours if you were given a sedative during your procedure.  Watch for any blood in your urine. If the amount of blood in your urine increases, call your health care provider.  If a tissue sample was removed for testing (biopsy) during your procedure, it is up to you to get your test results. Ask your health care provider, or the department that is doing the test, when your results will be ready.  Drink enough fluid to keep your urine pale yellow.  Keep all follow-up visits as told by your health care provider. This is important. Contact a health care provider if you:  Have pain that gets worse or does not get better with medicine, especially pain when you urinate.  Have trouble urinating.  Have more blood in your urine. Get help right away if you:  Have blood clots in your urine.  Have abdominal pain.  Have a fever or chills.  Are unable to urinate. Summary  Cystoscopy is a procedure that is used to help diagnose and sometimes treat conditions that affect the lower urinary tract.  Cystoscopy is done using   a thin, tube-shaped instrument with a light and camera at the end.  After the procedure, it is common to have some soreness or pain in your abdomen and urethra.  Watch for any blood in your urine. If the amount of blood in your urine increases, call your health care provider.  If you were prescribed an antibiotic medicine, take it as told by your health care provider. Do not stop taking the antibiotic even if you start to feel better. This  information is not intended to replace advice given to you by your health care provider. Make sure you discuss any questions you have with your health care provider. Document Revised: 08/30/2018 Document Reviewed: 08/30/2018 Elsevier Patient Education  2020 Elsevier Inc.   

## 2020-12-12 LAB — URINALYSIS, COMPLETE
Bilirubin, UA: NEGATIVE
Glucose, UA: NEGATIVE
Ketones, UA: NEGATIVE
Leukocytes,UA: NEGATIVE
Nitrite, UA: NEGATIVE
Protein,UA: NEGATIVE
Specific Gravity, UA: 1.025 (ref 1.005–1.030)
Urobilinogen, Ur: 0.2 mg/dL (ref 0.2–1.0)
pH, UA: 5.5 (ref 5.0–7.5)

## 2020-12-12 LAB — MICROSCOPIC EXAMINATION
Bacteria, UA: NONE SEEN
WBC, UA: NONE SEEN /hpf (ref 0–5)

## 2021-01-01 ENCOUNTER — Other Ambulatory Visit: Payer: Medicare Other | Admitting: Urology

## 2021-01-07 ENCOUNTER — Ambulatory Visit
Admission: RE | Admit: 2021-01-07 | Discharge: 2021-01-07 | Disposition: A | Discharge status: Home / Self Care | Payer: Medicare Other | Source: Ambulatory Visit | Attending: Urology | Admitting: Urology

## 2021-01-07 ENCOUNTER — Other Ambulatory Visit: Payer: Self-pay

## 2021-01-07 DIAGNOSIS — R3121 Asymptomatic microscopic hematuria: Secondary | ICD-10-CM | POA: Diagnosis not present

## 2021-01-07 LAB — POCT I-STAT CREATININE: Creatinine, Ser: 1.3 mg/dL — ABNORMAL HIGH (ref 0.61–1.24)

## 2021-01-07 MED ORDER — IOHEXOL 300 MG/ML  SOLN
125.0000 mL | Freq: Once | INTRAMUSCULAR | Status: AC | PRN
  Administered 2021-01-07: 125 mL via INTRAVENOUS

## 2021-01-09 ENCOUNTER — Encounter: Payer: Self-pay | Admitting: Urology

## 2021-01-09 ENCOUNTER — Ambulatory Visit: Payer: Medicare Other | Admitting: Urology

## 2021-01-09 ENCOUNTER — Other Ambulatory Visit: Payer: Self-pay

## 2021-01-09 VITALS — BP 154/79 | HR 86 | Ht 74.0 in | Wt 192.9 lb

## 2021-01-09 DIAGNOSIS — R319 Hematuria, unspecified: Secondary | ICD-10-CM

## 2021-01-09 DIAGNOSIS — R3121 Asymptomatic microscopic hematuria: Secondary | ICD-10-CM

## 2021-01-09 MED ORDER — LIDOCAINE HCL URETHRAL/MUCOSAL 2 % EX GEL
1.0000 "application " | Freq: Once | CUTANEOUS | Status: AC
  Administered 2021-01-09: 1 via URETHRAL

## 2021-01-09 NOTE — Progress Notes (Signed)
Cystoscopy Procedure Note:  Indication: Microscopic hematuria  After informed consent and discussion of the procedure and its risks, Chad Quinn was positioned and prepped in the standard fashion. Cystoscopy was performed with a flexible cystoscope. The urethra, bladder neck and entire bladder was visualized in a standard fashion. The prostate was moderate in size. The ureteral orifices were visualized in their normal location and orientation.  Bladder mucosa grossly normal, no abnormalities on retroflexion  Imaging: CT urogram with no urologic abnormalities  Findings: Normal cystoscopy  Assessment and Plan: Negative microscopic hematuria work-up, follow-up with urology as needed  Nickolas Madrid, MD 01/09/2021

## 2021-06-25 IMAGING — CT CT ABD-PEL WO/W CM
3 of 12 series · 11 of 46 positions shown, 17 images · IV contrast (APPLIED)
Comparison: 10/17/2018

CLINICAL DATA: Microscopic hematuria.

EXAM:
CT ABDOMEN AND PELVIS WITHOUT AND WITH CONTRAST
TECHNIQUE: Multidetector CT imaging of the abdomen and pelvis was performed
following the standard protocol before and following the bolus
administration of intravenous contrast.
CONTRAST:  125mL OMNIPAQUE IOHEXOL 300 MG/ML  SOLN

[Series 4: axial post · axial · 0.74mm/px · z∈[-1216,-856]mm · 7 of 97 slices shown, 12 images]
[im 13/97  soft-tissue]
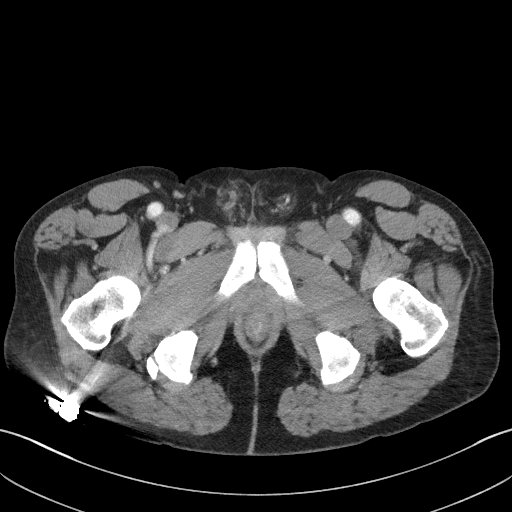
[im 13/97  bone]
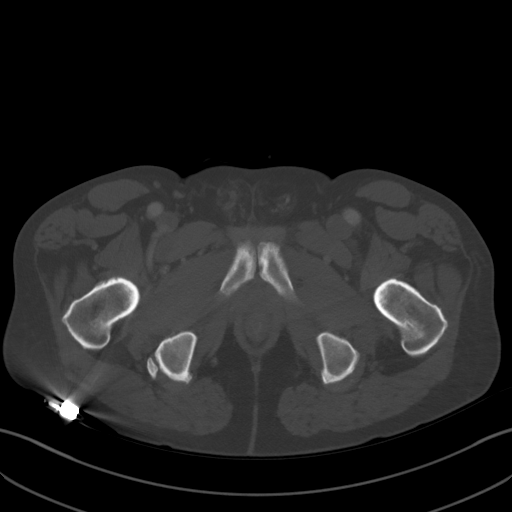
[im 25/97  soft-tissue]
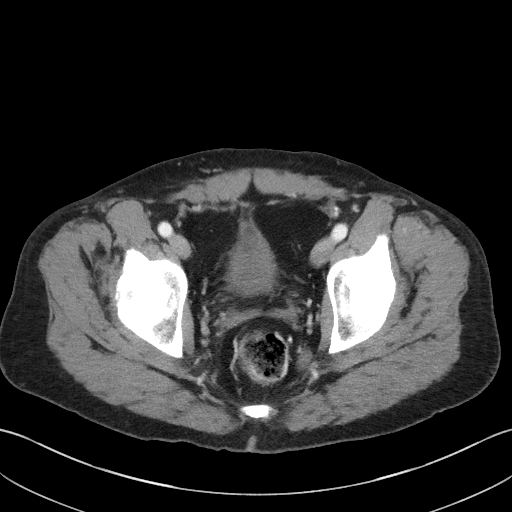
[im 37/97  soft-tissue]
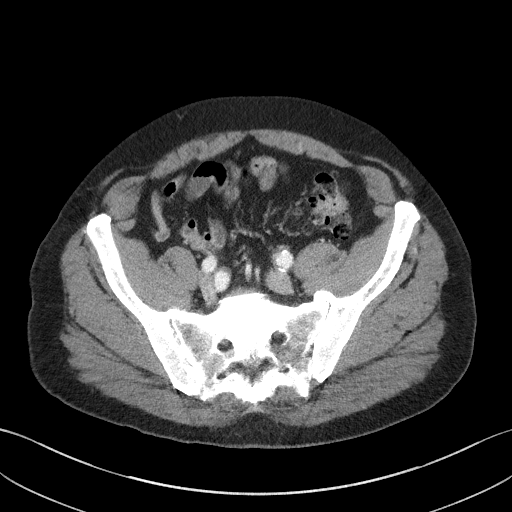
[im 49/97  soft-tissue]
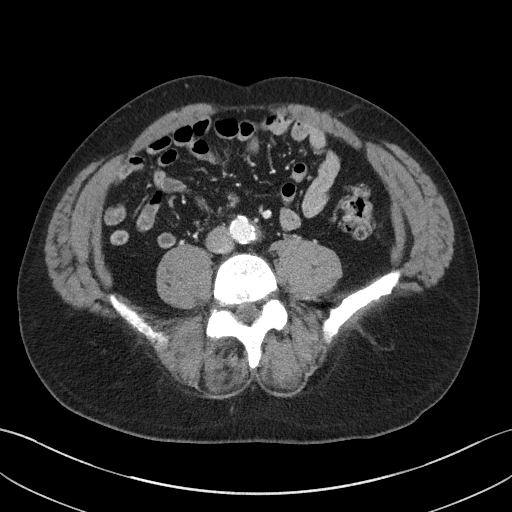
[im 49/97  lung]
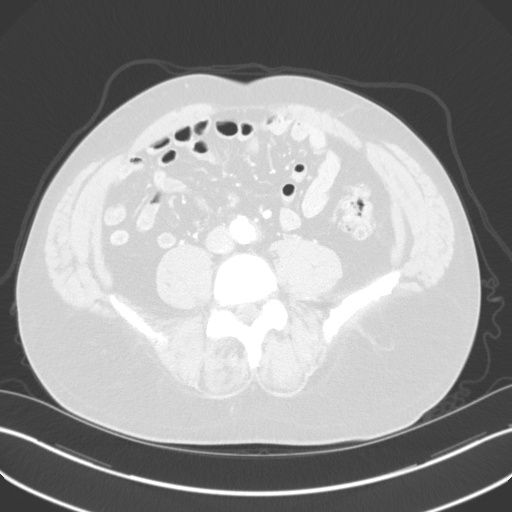
[im 61/97  soft-tissue]
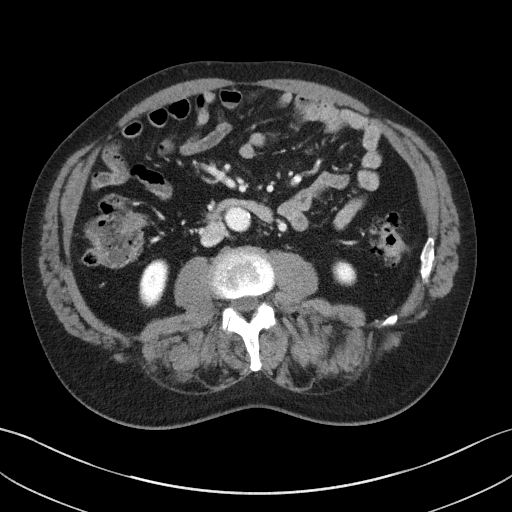
[im 61/97  lung]
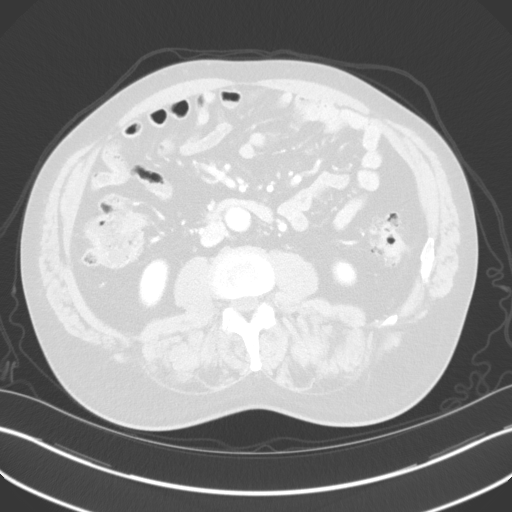
[im 73/97  soft-tissue]
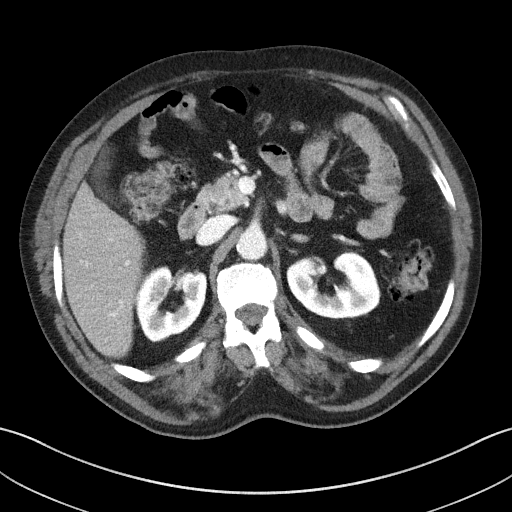
[im 73/97  lung]
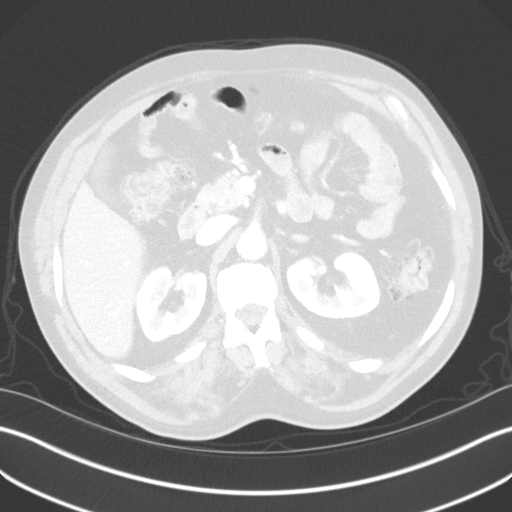
[im 85/97  soft-tissue]
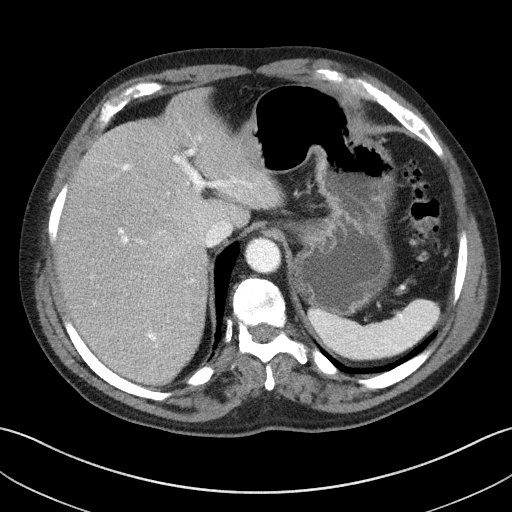
[im 85/97  lung]
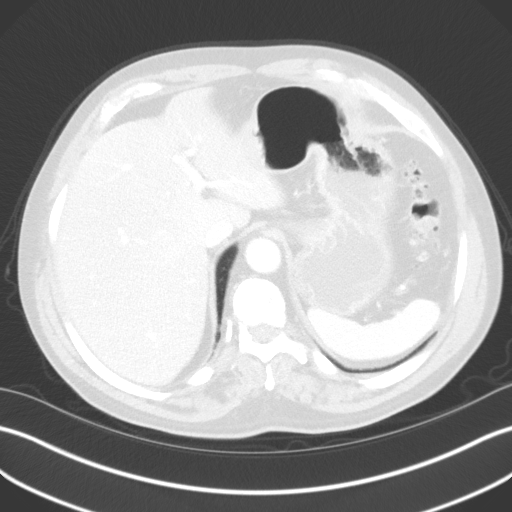

[Series 13: axial delay · axial · delayed · 0.84mm/px · z∈[-1140,-1080]mm · 2 of 99 slices shown]
[im 13/99  soft-tissue]
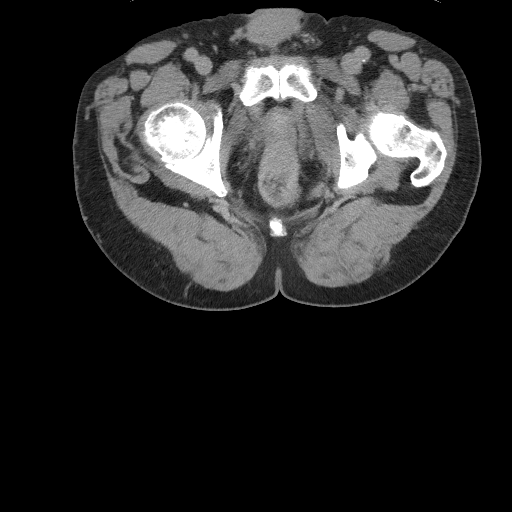
[im 25/99  soft-tissue]
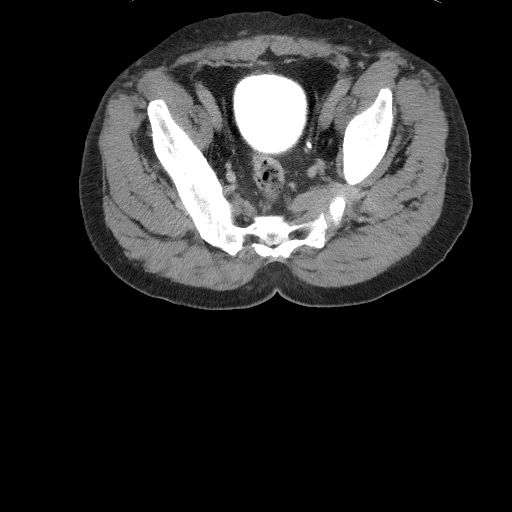

[Series 15: coronal delay · coronal · delayed · 0.74mm/px · 2 of 108 slices shown, 3 images]
[im 36/108  soft-tissue]
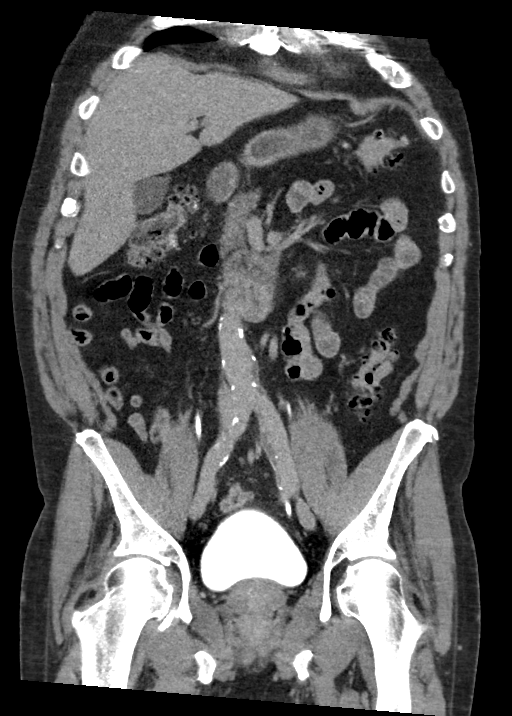
[im 36/108  bone]
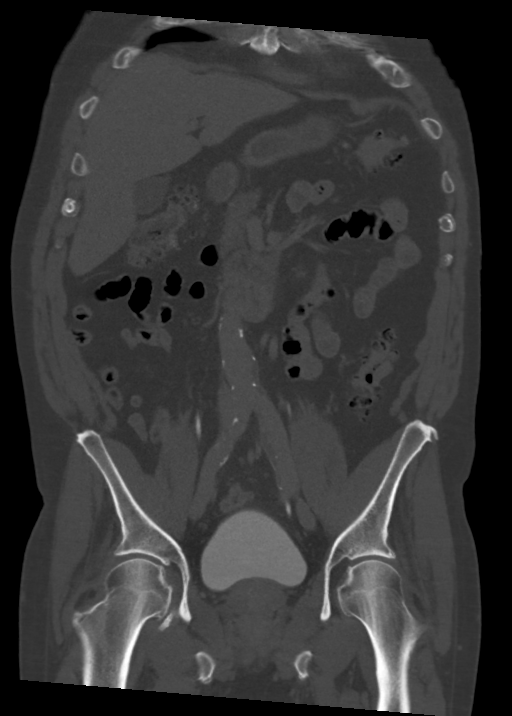
[im 72/108  soft-tissue]
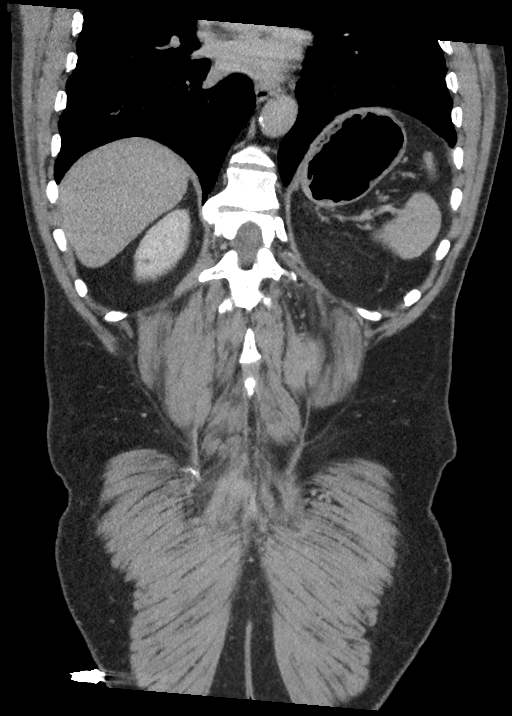

[11 of 46 positions shown; findings below may reference images not displayed]

FINDINGS: Lower chest: No acute abnormality.

Hepatobiliary: No focal liver abnormality is seen. No gallstones,
gallbladder wall thickening, or biliary dilatation.

Pancreas: Unremarkable. No pancreatic ductal dilatation or
surrounding inflammatory changes.

Spleen: Normal in size without focal abnormality.

Adrenals/Urinary Tract: Normal appearance of the adrenal glands.
Small, bilateral, subcentimeter low-density kidney lesions measure
up to 9 mm. These are technically too small to reliably
characterize. No kidney stone, hydronephrosis or hydroureter. On the
delayed images, no suspicious filling defects identified within the
collecting systems, ureters, or urinary bladder. Enlarged prostate
gland has mass effect upon the ventral base

Stomach/Bowel: Stomach appears normal. The appendix is visualized
and appears within normal limits. Diffuse colonic diverticulosis
noted without signs of acute diverticulitis.

Vascular/Lymphatic: Aortic atherosclerosis. No aneurysm. No
abdominopelvic adenopathy identified.

Reproductive: Mild prostate gland enlargement.

Other: No ascites or focal fluid collections. Signs of previous
right inguinal herniorrhaphy.

Musculoskeletal: Mild multilevel degenerative disc disease noted
within the lumbar spine. No acute or suspicious osseous findings.
IMPRESSION: 1. No acute findings identified within the abdomen or pelvis and no
explanation for patient's hematuria
2. Small bilateral low-density kidney lesions are technically too
small to reliably characterize.
3. Prostate gland enlargement
4.  Aortic Atherosclerosis ([id number].M).

## 2021-11-26 ENCOUNTER — Other Ambulatory Visit (HOSPITAL_COMMUNITY): Payer: Self-pay

## 2022-05-27 ENCOUNTER — Other Ambulatory Visit: Payer: Self-pay

## 2022-05-27 MED ORDER — TRIAMCINOLONE ACETONIDE 0.025 % EX CREA
TOPICAL_CREAM | Freq: Two times a day (BID) | CUTANEOUS | 0 refills | Status: AC
  Filled 2022-05-27 (×2): qty 30, 10d supply, fill #0

## 2022-05-27 MED ORDER — DOXYCYCLINE HYCLATE 100 MG PO CAPS
100.0000 mg | ORAL_CAPSULE | Freq: Two times a day (BID) | ORAL | 0 refills | Status: AC
  Filled 2022-05-27: qty 14, 7d supply, fill #0

## 2022-05-28 ENCOUNTER — Other Ambulatory Visit: Payer: Self-pay

## 2022-09-03 ENCOUNTER — Other Ambulatory Visit: Payer: Self-pay

## 2022-09-03 MED ORDER — TIZANIDINE HCL 2 MG PO CAPS
2.0000 mg | ORAL_CAPSULE | Freq: Every evening | ORAL | 0 refills | Status: AC
  Filled 2022-09-03: qty 20, 20d supply, fill #0

## 2022-09-03 MED ORDER — MELOXICAM 7.5 MG PO TABS
7.5000 mg | ORAL_TABLET | Freq: Every day | ORAL | 0 refills | Status: AC
  Filled 2022-09-03: qty 30, 30d supply, fill #0

## 2022-09-03 MED ORDER — METHYLPREDNISOLONE 4 MG PO TBPK
ORAL_TABLET | ORAL | 0 refills | Status: AC
  Filled 2022-09-03: qty 21, 6d supply, fill #0

## 2022-10-09 ENCOUNTER — Other Ambulatory Visit: Payer: Self-pay

## 2022-10-09 MED ORDER — MELOXICAM 15 MG PO TABS
15.0000 mg | ORAL_TABLET | Freq: Every day | ORAL | 2 refills | Status: AC
  Filled 2022-10-09: qty 90, 90d supply, fill #0

## 2023-07-05 ENCOUNTER — Other Ambulatory Visit: Payer: Self-pay

## 2023-07-05 MED ORDER — MELOXICAM 15 MG PO TABS
15.0000 mg | ORAL_TABLET | Freq: Every day | ORAL | 0 refills | Status: DC
  Filled 2023-07-05 (×2): qty 21, 21d supply, fill #0

## 2023-11-15 ENCOUNTER — Other Ambulatory Visit: Payer: Self-pay

## 2023-11-15 MED ORDER — PREDNISONE 10 MG PO TABS
ORAL_TABLET | ORAL | 0 refills | Status: DC
  Filled 2023-11-15: qty 21, 6d supply, fill #0

## 2023-11-15 MED ORDER — TIZANIDINE HCL 2 MG PO TABS
2.0000 mg | ORAL_TABLET | Freq: Three times a day (TID) | ORAL | 0 refills | Status: AC | PRN
  Filled 2023-11-15: qty 21, 7d supply, fill #0

## 2023-12-03 ENCOUNTER — Other Ambulatory Visit: Payer: Self-pay

## 2023-12-03 MED ORDER — CELECOXIB 100 MG PO CAPS
100.0000 mg | ORAL_CAPSULE | Freq: Two times a day (BID) | ORAL | 3 refills | Status: AC
  Filled 2023-12-03: qty 60, 30d supply, fill #0

## 2023-12-03 MED ORDER — GABAPENTIN 300 MG PO CAPS
300.0000 mg | ORAL_CAPSULE | Freq: Every day | ORAL | 3 refills | Status: AC
  Filled 2023-12-03: qty 30, 30d supply, fill #0

## 2023-12-08 ENCOUNTER — Other Ambulatory Visit: Payer: Self-pay

## 2023-12-08 MED ORDER — NA SULFATE-K SULFATE-MG SULF 17.5-3.13-1.6 GM/177ML PO SOLN
ORAL | 0 refills | Status: DC
  Filled 2023-12-08: qty 354, 1d supply, fill #0

## 2023-12-09 ENCOUNTER — Other Ambulatory Visit (HOSPITAL_COMMUNITY): Payer: Self-pay

## 2024-01-12 ENCOUNTER — Other Ambulatory Visit: Payer: Self-pay

## 2024-01-12 MED ORDER — MELOXICAM 15 MG PO TABS
15.0000 mg | ORAL_TABLET | Freq: Every day | ORAL | 2 refills | Status: AC
  Filled 2024-01-12 (×2): qty 90, 90d supply, fill #0

## 2024-01-20 ENCOUNTER — Encounter: Payer: Self-pay | Admitting: Gastroenterology

## 2024-01-27 ENCOUNTER — Encounter: Payer: Self-pay | Admitting: Gastroenterology

## 2024-01-27 ENCOUNTER — Ambulatory Visit: Admitting: Certified Registered"

## 2024-01-27 ENCOUNTER — Other Ambulatory Visit: Payer: Self-pay

## 2024-01-27 ENCOUNTER — Encounter
Admission: RE | Disposition: A | Discharge status: Home / Self Care | Payer: Self-pay | Source: Home / Self Care | Attending: Gastroenterology

## 2024-01-27 ENCOUNTER — Ambulatory Visit
Admission: RE | Admit: 2024-01-27 | Discharge: 2024-01-27 | Disposition: A | Discharge status: Home / Self Care | Attending: Gastroenterology | Admitting: Gastroenterology

## 2024-01-27 DIAGNOSIS — Z800 Family history of malignant neoplasm of digestive organs: Secondary | ICD-10-CM | POA: Insufficient documentation

## 2024-01-27 DIAGNOSIS — I10 Essential (primary) hypertension: Secondary | ICD-10-CM | POA: Diagnosis not present

## 2024-01-27 DIAGNOSIS — Z79899 Other long term (current) drug therapy: Secondary | ICD-10-CM | POA: Diagnosis not present

## 2024-01-27 DIAGNOSIS — D128 Benign neoplasm of rectum: Secondary | ICD-10-CM | POA: Diagnosis not present

## 2024-01-27 DIAGNOSIS — K635 Polyp of colon: Secondary | ICD-10-CM | POA: Diagnosis not present

## 2024-01-27 DIAGNOSIS — K5730 Diverticulosis of large intestine without perforation or abscess without bleeding: Secondary | ICD-10-CM | POA: Diagnosis not present

## 2024-01-27 DIAGNOSIS — Z1211 Encounter for screening for malignant neoplasm of colon: Secondary | ICD-10-CM | POA: Diagnosis present

## 2024-01-27 HISTORY — DX: Prediabetes: R73.03

## 2024-01-27 HISTORY — DX: Bilateral primary osteoarthritis of knee: M17.0

## 2024-01-27 HISTORY — PX: POLYPECTOMY: SHX149

## 2024-01-27 HISTORY — PX: COLONOSCOPY: SHX5424

## 2024-01-27 SURGERY — COLONOSCOPY
Anesthesia: General

## 2024-01-27 MED ORDER — DEXMEDETOMIDINE HCL IN NACL 200 MCG/50ML IV SOLN
INTRAVENOUS | Status: DC | PRN
Start: 2024-01-27 — End: 2024-01-27
  Administered 2024-01-27: 12 ug via INTRAVENOUS

## 2024-01-27 MED ORDER — SODIUM CHLORIDE 0.9 % IV SOLN: INTRAVENOUS | Status: DC

## 2024-01-27 MED ORDER — GLYCOPYRROLATE 0.2 MG/ML IJ SOLN
INTRAMUSCULAR | Status: DC | PRN
  Administered 2024-01-27: .2 mg via INTRAVENOUS

## 2024-01-27 MED ORDER — PROPOFOL 500 MG/50ML IV EMUL
INTRAVENOUS | Status: DC | PRN
Start: 2024-01-27 — End: 2024-01-27
  Administered 2024-01-27: 165 ug/kg/min via INTRAVENOUS

## 2024-01-27 MED ORDER — LIDOCAINE HCL (CARDIAC) PF 100 MG/5ML IV SOSY
PREFILLED_SYRINGE | INTRAVENOUS | Status: DC | PRN
  Administered 2024-01-27: 100 mg via INTRAVENOUS

## 2024-01-27 MED ORDER — EPHEDRINE SULFATE-NACL 50-0.9 MG/10ML-% IV SOSY
PREFILLED_SYRINGE | INTRAVENOUS | Status: DC | PRN
  Administered 2024-01-27: 10 mg via INTRAVENOUS

## 2024-01-27 MED ORDER — PROPOFOL 10 MG/ML IV BOLUS
INTRAVENOUS | Status: DC | PRN
  Administered 2024-01-27: 70 mg via INTRAVENOUS

## 2024-01-27 NOTE — Transfer of Care (Signed)
 Immediate Anesthesia Transfer of Care Note  Patient: Chad Quinn  Procedure(s) Performed: COLONOSCOPY POLYPECTOMY, INTESTINE  Patient Location: Endoscopy Unit  Anesthesia Type:General  Level of Consciousness: drowsy and patient cooperative  Airway & Oxygen Therapy: Patient Spontanous Breathing and Patient connected to face mask oxygen  Post-op Assessment: Report given to RN and Post -op Vital signs reviewed and stable  Post vital signs: Reviewed and stable  Last Vitals:  Vitals Value Taken Time  BP 87/57 01/27/24 0930  Temp 36.1 C 01/27/24 0930  Pulse 113 01/27/24 0932  Resp 19 01/27/24 0932  SpO2 100 % 01/27/24 0932  Vitals shown include unfiled device data.  Last Pain:  Vitals:   01/27/24 0930  TempSrc: Temporal  PainSc: Asleep         Complications: No notable events documented.

## 2024-01-27 NOTE — Anesthesia Procedure Notes (Signed)
 Procedure Name: General with mask airway Date/Time: 01/27/2024 9:07 AM  Performed by: Niki Barter, CRNAPre-anesthesia Checklist: Patient identified, Emergency Drugs available, Suction available and Patient being monitored Patient Re-evaluated:Patient Re-evaluated prior to induction Oxygen Delivery Method: Simple face mask Induction Type: IV induction Placement Confirmation: positive ETCO2 and breath sounds checked- equal and bilateral Dental Injury: Teeth and Oropharynx as per pre-operative assessment

## 2024-01-27 NOTE — Anesthesia Postprocedure Evaluation (Signed)
 Anesthesia Post Note  Patient: Chad Quinn  Procedure(s) Performed: COLONOSCOPY POLYPECTOMY, INTESTINE  Patient location during evaluation: PACU Anesthesia Type: General Level of consciousness: awake and alert Pain management: pain level controlled Vital Signs Assessment: post-procedure vital signs reviewed and stable Respiratory status: spontaneous breathing, nonlabored ventilation, respiratory function stable and patient connected to nasal cannula oxygen Cardiovascular status: blood pressure returned to baseline and stable Postop Assessment: no apparent nausea or vomiting Anesthetic complications: no   No notable events documented.   Last Vitals:  Vitals:   01/27/24 0844  BP: (!) 150/90  Pulse: 81  Resp: 18  Temp: (!) 36.3 C  SpO2: 100%    Last Pain:  Vitals:   01/27/24 0844  TempSrc: Temporal  PainSc: 0-No pain                 Zula Hitch

## 2024-01-27 NOTE — Anesthesia Preprocedure Evaluation (Signed)
 Anesthesia Evaluation  Patient identified by MRN, date of birth, ID band Patient awake    Reviewed: Allergy & Precautions, H&P , NPO status , Patient's Chart, lab work & pertinent test results, reviewed documented beta blocker date and time   Airway Mallampati: II   Neck ROM: full    Dental  (+) Poor Dentition   Pulmonary neg pulmonary ROS   Pulmonary exam normal        Cardiovascular Exercise Tolerance: Good hypertension, On Medications negative cardio ROS Normal cardiovascular exam Rhythm:regular Rate:Normal     Neuro/Psych negative neurological ROS  negative psych ROS   GI/Hepatic negative GI ROS, Neg liver ROS,,,  Endo/Other  negative endocrine ROS    Renal/GU negative Renal ROS  negative genitourinary   Musculoskeletal   Abdominal   Peds  Hematology negative hematology ROS (+)   Anesthesia Other Findings Past Medical History: No date: Borderline diabetes No date: Hypertension No date: Pre-diabetes No date: Primary osteoarthritis of both knees Past Surgical History: No date: COLONOSCOPY No date: INGUINAL HERNIA REPAIR; Bilateral BMI    Body Mass Index: 24.14 kg/m     Reproductive/Obstetrics negative OB ROS                             Anesthesia Physical Anesthesia Plan  ASA: 2  Anesthesia Plan: General   Post-op Pain Management:    Induction:   PONV Risk Score and Plan:   Airway Management Planned:   Additional Equipment:   Intra-op Plan:   Post-operative Plan:   Informed Consent: I have reviewed the patients History and Physical, chart, labs and discussed the procedure including the risks, benefits and alternatives for the proposed anesthesia with the patient or authorized representative who has indicated his/her understanding and acceptance.     Dental Advisory Given  Plan Discussed with: CRNA  Anesthesia Plan Comments:        Anesthesia Quick  Evaluation

## 2024-01-27 NOTE — H&P (Signed)
 Pre-Procedure H&P   Patient ID: Chad Quinn is a 68 y.o. male.  Gastroenterology Provider: Quintin Buckle, DO  Referring Provider: Dr. Jerone Moorman PCP: [facility]  Date: 01/27/2024  HPI Mr. Chad Quinn is a 68 y.o. male who presents today for Colonoscopy for Personal history of colon polyps, family history of colon cancer .  Father with history of colon cancer.  Last underwent colonoscopy in 2017 with 1 adenomatous polyp and pandiverticulosis.  2014 also had an adenomatous polyp  Hemoglobin 14.7 MCV 85 platelets 213,000 creatinine 1.2   BM 1-2 times a day. No melena/hematochezia   Past Medical History:  Diagnosis Date   Borderline diabetes    Hypertension    Pre-diabetes    Primary osteoarthritis of both knees     Past Surgical History:  Procedure Laterality Date   COLONOSCOPY     INGUINAL HERNIA REPAIR Bilateral     Family History Father- crc 60s/70s No h/o GI disease or malignancy  Review of Systems  Constitutional:  Negative for activity change, appetite change, chills, diaphoresis, fatigue, fever and unexpected weight change.  HENT:  Negative for trouble swallowing and voice change.   Respiratory:  Negative for shortness of breath and wheezing.   Cardiovascular:  Negative for chest pain, palpitations and leg swelling.  Gastrointestinal:  Negative for abdominal distention, abdominal pain, anal bleeding, blood in stool, constipation, diarrhea, nausea and vomiting.  Musculoskeletal:  Negative for arthralgias and myalgias.  Skin:  Negative for color change and pallor.  Neurological:  Negative for dizziness, syncope and weakness.  Psychiatric/Behavioral:  Negative for confusion. The patient is not nervous/anxious.   All other systems reviewed and are negative.    Medications No current facility-administered medications on file prior to encounter.   Current Outpatient Medications on File Prior to Encounter  Medication Sig Dispense  Refill   acetaminophen  (TYLENOL ) 500 MG tablet Take 500 mg by mouth every 6 (six) hours as needed for mild pain or moderate pain (arthritis).     celecoxib  (CELEBREX ) 100 MG capsule Take 1 capsule (100 mg total) by mouth 2 (two) times daily 60 capsule 3   cholecalciferol (VITAMIN D3) 25 MCG (1000 UNIT) tablet Take 1,000 Units by mouth daily.     COLLAGEN PO Take by mouth.     doxycycline  (VIBRAMYCIN ) 100 MG capsule Take 1 capsule (100 mg total) by mouth 2 (two) times daily for 7 days 14 capsule 0   gabapentin  (NEURONTIN ) 300 MG capsule Take 1 capsule (300 mg total) by mouth at bedtime. 30 capsule 3   GARLIC OIL PO Take by mouth.     Magnesium Oxide (MAG-OX PO) Take by mouth.     meloxicam  (MOBIC ) 15 MG tablet Take 1 tablet (15 mg total) by mouth daily after a meal. 90 tablet 2   meloxicam  (MOBIC ) 7.5 MG tablet Take 1 tablet (7.5 mg total) by mouth daily. 30 tablet 0   Na Sulfate-K Sulfate-Mg Sulfate concentrate (SUPREP) 17.5-3.13-1.6 GM/177ML SOLN Take 2 bottles (1 kit) by mouth as directed. Take both bottles at the times instructed by your provider. 354 mL 0   Omega-3 Fatty Acids (FISH OIL) 1000 MG CAPS Take by mouth.     tizanidine  (ZANAFLEX ) 2 MG capsule Take 1 capsule (2 mg total) by mouth at bedtime. 20 capsule 0   triamcinolone  (KENALOG ) 0.025 % cream Apply topically 2 (two) times daily 30 g 0   vitamin E 1000 UNIT capsule Take 1,000 Units by mouth daily.  Zinc  100 MG TABS Take by mouth.      Pertinent medications related to GI and procedure were reviewed by me with the patient prior to the procedure   Current Facility-Administered Medications:    0.9 %  sodium chloride  infusion, , Intravenous, Continuous, Quintin Buckle, DO, Last Rate: 20 mL/hr at 01/27/24 0851, New Bag at 01/27/24 0851  sodium chloride  20 mL/hr at 01/27/24 1914       Allergies  Allergen Reactions   Hydrochlorothiazide Swelling   Shrimp [Shellfish Allergy]     Throat swells, sneezing   Allergies  were reviewed by me prior to the procedure  Objective   Body mass index is 24.14 kg/m. Vitals:   01/27/24 0844  BP: (!) 150/90  Pulse: 81  Resp: 18  Temp: (!) 97.3 F (36.3 C)  TempSrc: Temporal  SpO2: 100%  Weight: 85.3 kg  Height: 6\' 2"  (1.88 m)     Physical Exam Vitals and nursing note reviewed.  Constitutional:      General: He is not in acute distress.    Appearance: Normal appearance. He is not ill-appearing, toxic-appearing or diaphoretic.  HENT:     Head: Normocephalic and atraumatic.     Nose: Nose normal.     Mouth/Throat:     Mouth: Mucous membranes are moist.     Pharynx: Oropharynx is clear.  Eyes:     General: No scleral icterus.    Extraocular Movements: Extraocular movements intact.  Cardiovascular:     Rate and Rhythm: Normal rate and regular rhythm.     Heart sounds: Normal heart sounds. No murmur heard.    No friction rub. No gallop.  Pulmonary:     Effort: Pulmonary effort is normal. No respiratory distress.     Breath sounds: Normal breath sounds. No wheezing, rhonchi or rales.  Abdominal:     General: Bowel sounds are normal. There is no distension.     Palpations: Abdomen is soft.     Tenderness: There is no abdominal tenderness. There is no guarding or rebound.  Musculoskeletal:     Cervical back: Neck supple.     Right lower leg: No edema.     Left lower leg: No edema.  Skin:    General: Skin is warm and dry.     Coloration: Skin is not jaundiced or pale.  Neurological:     General: No focal deficit present.     Mental Status: He is alert and oriented to person, place, and time. Mental status is at baseline.  Psychiatric:        Mood and Affect: Mood normal.        Behavior: Behavior normal.        Thought Content: Thought content normal.        Judgment: Judgment normal.      Assessment:  Mr. Chad Quinn is a 68 y.o. male  who presents today for Colonoscopy for Personal history of colon polyps, family history of colon  cancer.  Plan:  Colonoscopy with possible intervention today  Colonoscopy with possible biopsy, control of bleeding, polypectomy, and interventions as necessary has been discussed with the patient/patient representative. Informed consent was obtained from the patient/patient representative after explaining the indication, nature, and risks of the procedure including but not limited to death, bleeding, perforation, missed neoplasm/lesions, cardiorespiratory compromise, and reaction to medications. Opportunity for questions was given and appropriate answers were provided. Patient/patient representative has verbalized understanding is amenable to undergoing the procedure.  Quintin Buckle, DO  [facility] Gastroenterology  Portions of the record may have been created with voice recognition software. Occasional wrong-word or 'sound-a-like' substitutions may have occurred due to the inherent limitations of voice recognition software.  Read the chart carefully and recognize, using context, where substitutions may have occurred.

## 2024-01-27 NOTE — Op Note (Signed)
 [facility] Gastroenterology Patient Name: Chad Quinn Procedure Date: 01/27/2024 8:56 AM MRN: [id number] Account #: [id number] Date of Birth: 05/13/1956 Admit Type: Outpatient Age: 68 Room: [facility] ENDO ROOM 1 Gender: Male Note Status: Finalized Instrument Name: Colonoscope [id number] Procedure:             Colonoscopy Indications:           High risk colon cancer surveillance: Personal history                         of colonic polyps Providers:             Quintin Buckle DO, DO Medicines:             Monitored Anesthesia Care Complications:         No immediate complications. Estimated blood loss:                         Minimal. Procedure:             Pre-Anesthesia Assessment:                        - Prior to the procedure, a History and Physical was                         performed, and patient medications and allergies were                         reviewed. The patient is competent. The risks and                         benefits of the procedure and the sedation options and                         risks were discussed with the patient. All questions                         were answered and informed consent was obtained.                         Patient identification and proposed procedure were                         verified by the physician, the nurse, the anesthetist                         and the technician in the endoscopy suite. Mental                         Status Examination: alert and oriented. Airway                         Examination: normal oropharyngeal airway and neck                         mobility. Respiratory Examination: clear to                         auscultation. CV Examination: RRR, no murmurs, no S3  or S4. Prophylactic Antibiotics: The patient does not                         require prophylactic antibiotics. Prior                         Anticoagulants: The patient has taken no anticoagulant                          or antiplatelet agents. ASA Grade Assessment: II - A                         patient with mild systemic disease. After reviewing                         the risks and benefits, the patient was deemed in                         satisfactory condition to undergo the procedure. The                         anesthesia plan was to use monitored anesthesia care                         (MAC). Immediately prior to administration of                         medications, the patient was re-assessed for adequacy                         to receive sedatives. The heart rate, respiratory                         rate, oxygen saturations, blood pressure, adequacy of                         pulmonary ventilation, and response to care were                         monitored throughout the procedure. The physical                         status of the patient was re-assessed after the                         procedure.                        After obtaining informed consent, the colonoscope was                         passed under direct vision. Throughout the procedure,                         the patient's blood pressure, pulse, and oxygen                         saturations were monitored continuously. The  Colonoscope was introduced through the anus and                         advanced to the the terminal ileum, with                         identification of the appendiceal orifice and IC                         valve. The colonoscopy was performed without                         difficulty. The patient tolerated the procedure well.                         The quality of the bowel preparation was evaluated                         using the BBPS [facility] Bowel Preparation Scale) with                         scores of: Right Colon = 2 (minor amount of residual                         staining, small fragments of stool and/or opaque                         liquid, but mucosa  seen well), Transverse Colon = 2                         (minor amount of residual staining, small fragments of                         stool and/or opaque liquid, but mucosa seen well) and                         Left Colon = 2 (minor amount of residual staining,                         small fragments of stool and/or opaque liquid, but                         mucosa seen well). The total BBPS score equals 6. The                         quality of the bowel preparation was good. The                         terminal ileum, ileocecal valve, appendiceal orifice,                         and rectum were photographed. Findings:      The perianal and digital rectal examinations were normal. Pertinent       negatives include normal sphincter tone.      The terminal ileum appeared normal. Estimated blood loss: none.      Retroflexion in the right colon was performed.  A 1 to 2 mm polyp was found in the rectum. The polyp was sessile. The       polyp was removed with a jumbo cold forceps. Resection and retrieval       were complete. Estimated blood loss was minimal.      A 3 to 4 mm polyp was found in the rectum. The polyp was sessile. The       polyp was removed with a cold snare. Resection and retrieval were       complete. Estimated blood loss was minimal.      Multiple small-mouthed diverticula were found in the entire colon.       Estimated blood loss: none.      The exam was otherwise without abnormality on direct and retroflexion       views. Impression:            - The examined portion of the ileum was normal.                        - One 1 to 2 mm polyp in the rectum, removed with a                         jumbo cold forceps. Resected and retrieved.                        - One 3 to 4 mm polyp in the rectum, removed with a                         cold snare. Resected and retrieved.                        - Diverticulosis in the entire examined colon.                        - The  examination was otherwise normal on direct and                         retroflexion views. Recommendation:        - Patient has a contact number available for                         emergencies. The signs and symptoms of potential                         delayed complications were discussed with the patient.                         Return to normal activities tomorrow. Written                         discharge instructions were provided to the patient.                        - Discharge patient to home.                        - Resume previous diet.                        -  Continue present medications.                        - Await pathology results.                        - Repeat colonoscopy for surveillance based on                         pathology results.                        - Return to referring physician as previously                         scheduled.                        - The findings and recommendations were discussed with                         the patient. 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 All rights reserved. The codes documented in this report are preliminary and upon coder review may  be revised to meet current compliance requirements. Attending Participation:      I personally performed the entire procedure. Polo Brisk, DO Quintin Buckle DO, DO 01/27/2024 9:29:26 AM This report has been signed electronically. Number of Addenda: 0 Note Initiated On: 01/27/2024 8:56 AM Scope Withdrawal Time: 0 hours 12 minutes 52 seconds  Total Procedure Duration: 0 hours 15 minutes 51 seconds  Estimated Blood Loss:  Estimated blood loss was minimal.      [facility]

## 2024-01-27 NOTE — Interval H&P Note (Signed)
 History and Physical Interval Note: Preprocedure H&P from 01/27/24  was reviewed and there was no interval change after seeing and examining the patient.  Written consent was obtained from the patient after discussion of risks, benefits, and alternatives. Patient has consented to proceed with Colonoscopy with possible intervention   01/27/2024 9:01 AM  [name]  has presented today for surgery, with the diagnosis of History of colon polyps (Z86.0100).  The various methods of treatment have been discussed with the patient and family. After consideration of risks, benefits and other options for treatment, the patient has consented to  Procedure(s): COLONOSCOPY (N/A) as a surgical intervention.  The patient's history has been reviewed, patient examined, no change in status, stable for surgery.  I have reviewed the patient's chart and labs.  Questions were answered to the patient's satisfaction.     Chad Quinn

## 2024-01-28 ENCOUNTER — Encounter: Payer: Self-pay | Admitting: Gastroenterology

## 2024-01-28 LAB — SURGICAL PATHOLOGY

## 2024-07-11 ENCOUNTER — Other Ambulatory Visit: Payer: Self-pay

## 2024-07-11 MED ORDER — AMLODIPINE BESYLATE 5 MG PO TABS
5.0000 mg | ORAL_TABLET | Freq: Every day | ORAL | 1 refills | Status: AC
  Filled 2024-07-11: qty 100, 100d supply, fill #0
